# Patient Record
Sex: Female | Born: 1935 | Race: White | Hispanic: No | Marital: Married | Smoking: Never smoker
Health system: Southern US, Community
[De-identification: ages and names within clinical notes are randomized; demographics above are authoritative.]

## PROBLEM LIST (undated history)

## (undated) DIAGNOSIS — I4891 Unspecified atrial fibrillation: Secondary | ICD-10-CM

## (undated) DIAGNOSIS — I77819 Aortic ectasia, unspecified site: Secondary | ICD-10-CM

## (undated) DIAGNOSIS — C20 Malignant neoplasm of rectum: Secondary | ICD-10-CM

## (undated) DIAGNOSIS — I10 Essential (primary) hypertension: Secondary | ICD-10-CM

## (undated) DIAGNOSIS — K56609 Unspecified intestinal obstruction, unspecified as to partial versus complete obstruction: Secondary | ICD-10-CM

## (undated) DIAGNOSIS — R7989 Other specified abnormal findings of blood chemistry: Secondary | ICD-10-CM

## (undated) DIAGNOSIS — K219 Gastro-esophageal reflux disease without esophagitis: Secondary | ICD-10-CM

## (undated) DIAGNOSIS — N824 Other female intestinal-genital tract fistulae: Secondary | ICD-10-CM

## (undated) DIAGNOSIS — M25473 Effusion, unspecified ankle: Secondary | ICD-10-CM

## (undated) DIAGNOSIS — C541 Malignant neoplasm of endometrium: Secondary | ICD-10-CM

## (undated) DIAGNOSIS — K5790 Diverticulosis of intestine, part unspecified, without perforation or abscess without bleeding: Secondary | ICD-10-CM

## (undated) DIAGNOSIS — E538 Deficiency of other specified B group vitamins: Secondary | ICD-10-CM

## (undated) DIAGNOSIS — K2980 Duodenitis without bleeding: Secondary | ICD-10-CM

## (undated) DIAGNOSIS — E785 Hyperlipidemia, unspecified: Secondary | ICD-10-CM

## (undated) DIAGNOSIS — M47816 Spondylosis without myelopathy or radiculopathy, lumbar region: Secondary | ICD-10-CM

## (undated) DIAGNOSIS — M4696 Unspecified inflammatory spondylopathy, lumbar region: Secondary | ICD-10-CM

## (undated) DIAGNOSIS — D649 Anemia, unspecified: Secondary | ICD-10-CM

## (undated) DIAGNOSIS — N189 Chronic kidney disease, unspecified: Secondary | ICD-10-CM

## (undated) HISTORY — DX: Hyperlipidemia, unspecified: E78.5

## (undated) HISTORY — DX: Spondylosis without myelopathy or radiculopathy, lumbar region: M47.816

## (undated) HISTORY — DX: Other female intestinal-genital tract fistulae: N82.4

## (undated) HISTORY — DX: Gastro-esophageal reflux disease without esophagitis: K21.9

## (undated) HISTORY — PX: ABDOMINAL HYSTERECTOMY: SHX81

## (undated) HISTORY — DX: Anemia, unspecified: D64.9

## (undated) HISTORY — DX: Chronic kidney disease, unspecified: N18.9

## (undated) HISTORY — DX: Malignant neoplasm of rectum: C20

## (undated) HISTORY — DX: Aortic ectasia, unspecified site: I77.819

## (undated) HISTORY — PX: APPENDECTOMY: SHX54

## (undated) HISTORY — DX: Duodenitis without bleeding: K29.80

## (undated) HISTORY — DX: Essential (primary) hypertension: I10

## (undated) HISTORY — DX: Malignant neoplasm of endometrium: C54.1

---

## 2004-08-02 ENCOUNTER — Ambulatory Visit: Payer: Self-pay | Admitting: Family Medicine

## 2008-08-12 ENCOUNTER — Inpatient Hospital Stay: Payer: Self-pay | Admitting: Internal Medicine

## 2008-10-11 ENCOUNTER — Inpatient Hospital Stay: Payer: Self-pay | Admitting: Vascular Surgery

## 2008-10-27 ENCOUNTER — Ambulatory Visit: Payer: Self-pay | Admitting: Family Medicine

## 2009-07-19 ENCOUNTER — Inpatient Hospital Stay: Payer: Self-pay | Admitting: Specialist

## 2009-07-24 ENCOUNTER — Encounter: Payer: Self-pay | Admitting: Internal Medicine

## 2013-08-25 ENCOUNTER — Ambulatory Visit: Payer: Self-pay | Admitting: Family Medicine

## 2013-09-18 ENCOUNTER — Ambulatory Visit: Payer: Self-pay | Admitting: Gastroenterology

## 2013-09-20 LAB — PATHOLOGY REPORT

## 2013-09-25 ENCOUNTER — Ambulatory Visit: Payer: Self-pay | Admitting: Oncology

## 2013-09-25 LAB — CBC CANCER CENTER
BASOS ABS: 0.1 x10 3/mm (ref 0.0–0.1)
Basophil %: 0.9 %
Eosinophil #: 0.1 x10 3/mm (ref 0.0–0.7)
Eosinophil %: 1.5 %
HCT: 32.3 % — ABNORMAL LOW (ref 35.0–47.0)
HGB: 11 g/dL — AB (ref 12.0–16.0)
LYMPHS PCT: 22.5 %
Lymphocyte #: 2.3 x10 3/mm (ref 1.0–3.6)
MCH: 32.1 pg (ref 26.0–34.0)
MCHC: 34.1 g/dL (ref 32.0–36.0)
MCV: 94 fL (ref 80–100)
MONO ABS: 0.9 x10 3/mm (ref 0.2–0.9)
Monocyte %: 8.5 %
Neutrophil #: 6.7 x10 3/mm — ABNORMAL HIGH (ref 1.4–6.5)
Neutrophil %: 66.6 %
Platelet: 277 x10 3/mm (ref 150–440)
RBC: 3.42 10*6/uL — ABNORMAL LOW (ref 3.80–5.20)
RDW: 14 % (ref 11.5–14.5)
WBC: 10.1 x10 3/mm (ref 3.6–11.0)

## 2013-09-25 LAB — BASIC METABOLIC PANEL
Anion Gap: 6 — ABNORMAL LOW (ref 7–16)
BUN: 28 mg/dL — ABNORMAL HIGH (ref 7–18)
CALCIUM: 8.9 mg/dL (ref 8.5–10.1)
CHLORIDE: 105 mmol/L (ref 98–107)
CO2: 26 mmol/L (ref 21–32)
Creatinine: 1.59 mg/dL — ABNORMAL HIGH (ref 0.60–1.30)
GFR CALC AF AMER: 33 — AB
GFR CALC NON AF AMER: 28 — AB
Glucose: 132 mg/dL — ABNORMAL HIGH (ref 65–99)
OSMOLALITY: 281 (ref 275–301)
POTASSIUM: 4.7 mmol/L (ref 3.5–5.1)
SODIUM: 137 mmol/L (ref 136–145)

## 2013-09-26 LAB — CEA: CEA: 22.1 ng/mL — ABNORMAL HIGH (ref 0.0–4.7)

## 2013-09-30 ENCOUNTER — Ambulatory Visit: Payer: Self-pay | Admitting: Oncology

## 2013-10-19 ENCOUNTER — Ambulatory Visit: Payer: Self-pay | Admitting: Oncology

## 2013-10-27 LAB — CBC CANCER CENTER
BASOS PCT: 0.7 %
Basophil #: 0.1 x10 3/mm (ref 0.0–0.1)
EOS ABS: 0.2 x10 3/mm (ref 0.0–0.7)
Eosinophil %: 1.8 %
HCT: 31.9 % — AB (ref 35.0–47.0)
HGB: 10.6 g/dL — AB (ref 12.0–16.0)
LYMPHS ABS: 2.6 x10 3/mm (ref 1.0–3.6)
Lymphocyte %: 25.3 %
MCH: 31.2 pg (ref 26.0–34.0)
MCHC: 33.2 g/dL (ref 32.0–36.0)
MCV: 94 fL (ref 80–100)
MONOS PCT: 7.5 %
Monocyte #: 0.8 x10 3/mm (ref 0.2–0.9)
NEUTROS ABS: 6.6 x10 3/mm — AB (ref 1.4–6.5)
Neutrophil %: 64.7 %
PLATELETS: 240 x10 3/mm (ref 150–440)
RBC: 3.39 10*6/uL — AB (ref 3.80–5.20)
RDW: 13.7 % (ref 11.5–14.5)
WBC: 10.3 x10 3/mm (ref 3.6–11.0)

## 2013-11-03 LAB — CBC CANCER CENTER
Basophil #: 0.1 x10 3/mm (ref 0.0–0.1)
Basophil %: 0.8 %
Eosinophil #: 0.2 x10 3/mm (ref 0.0–0.7)
Eosinophil %: 1.8 %
HCT: 30.8 % — ABNORMAL LOW (ref 35.0–47.0)
HGB: 10.3 g/dL — ABNORMAL LOW (ref 12.0–16.0)
Lymphocyte #: 1.7 x10 3/mm (ref 1.0–3.6)
Lymphocyte %: 18.7 %
MCH: 31.9 pg (ref 26.0–34.0)
MCHC: 33.6 g/dL (ref 32.0–36.0)
MCV: 95 fL (ref 80–100)
MONO ABS: 0.7 x10 3/mm (ref 0.2–0.9)
Monocyte %: 8 %
NEUTROS ABS: 6.6 x10 3/mm — AB (ref 1.4–6.5)
Neutrophil %: 70.7 %
Platelet: 242 x10 3/mm (ref 150–440)
RBC: 3.24 10*6/uL — AB (ref 3.80–5.20)
RDW: 13.9 % (ref 11.5–14.5)
WBC: 9.4 x10 3/mm (ref 3.6–11.0)

## 2013-11-10 LAB — CBC CANCER CENTER
BASOS ABS: 0.1 x10 3/mm (ref 0.0–0.1)
Basophil %: 0.7 %
EOS ABS: 0.2 x10 3/mm (ref 0.0–0.7)
Eosinophil %: 1.6 %
HCT: 32.6 % — AB (ref 35.0–47.0)
HGB: 10.8 g/dL — AB (ref 12.0–16.0)
Lymphocyte #: 2.1 x10 3/mm (ref 1.0–3.6)
Lymphocyte %: 18.8 %
MCH: 31.2 pg (ref 26.0–34.0)
MCHC: 33.2 g/dL (ref 32.0–36.0)
MCV: 94 fL (ref 80–100)
Monocyte #: 0.9 x10 3/mm (ref 0.2–0.9)
Monocyte %: 7.9 %
Neutrophil #: 7.8 x10 3/mm — ABNORMAL HIGH (ref 1.4–6.5)
Neutrophil %: 71 %
Platelet: 270 x10 3/mm (ref 150–440)
RBC: 3.47 10*6/uL — ABNORMAL LOW (ref 3.80–5.20)
RDW: 13.9 % (ref 11.5–14.5)
WBC: 11.1 x10 3/mm — AB (ref 3.6–11.0)

## 2013-11-17 LAB — CBC CANCER CENTER
Basophil #: 0.1 x10 3/mm (ref 0.0–0.1)
Basophil %: 0.5 %
EOS PCT: 0.9 %
Eosinophil #: 0.1 x10 3/mm (ref 0.0–0.7)
HCT: 30.3 % — AB (ref 35.0–47.0)
HGB: 10.2 g/dL — AB (ref 12.0–16.0)
Lymphocyte #: 1.6 x10 3/mm (ref 1.0–3.6)
Lymphocyte %: 15.6 %
MCH: 31.6 pg (ref 26.0–34.0)
MCHC: 33.5 g/dL (ref 32.0–36.0)
MCV: 95 fL (ref 80–100)
Monocyte #: 0.8 x10 3/mm (ref 0.2–0.9)
Monocyte %: 7.4 %
NEUTROS PCT: 75.6 %
Neutrophil #: 7.9 x10 3/mm — ABNORMAL HIGH (ref 1.4–6.5)
Platelet: 250 x10 3/mm (ref 150–440)
RBC: 3.21 10*6/uL — AB (ref 3.80–5.20)
RDW: 13.6 % (ref 11.5–14.5)
WBC: 10.4 x10 3/mm (ref 3.6–11.0)

## 2013-11-19 ENCOUNTER — Ambulatory Visit: Payer: Self-pay | Admitting: Oncology

## 2013-11-24 LAB — CBC CANCER CENTER
BASOS ABS: 0.1 x10 3/mm (ref 0.0–0.1)
BASOS PCT: 0.5 %
EOS ABS: 0.1 x10 3/mm (ref 0.0–0.7)
EOS PCT: 0.9 %
HCT: 31.9 % — ABNORMAL LOW (ref 35.0–47.0)
HGB: 10.7 g/dL — ABNORMAL LOW (ref 12.0–16.0)
Lymphocyte #: 1.4 x10 3/mm (ref 1.0–3.6)
Lymphocyte %: 14.1 %
MCH: 31.6 pg (ref 26.0–34.0)
MCHC: 33.5 g/dL (ref 32.0–36.0)
MCV: 94 fL (ref 80–100)
MONOS PCT: 7.2 %
Monocyte #: 0.7 x10 3/mm (ref 0.2–0.9)
NEUTROS PCT: 77.3 %
Neutrophil #: 7.8 x10 3/mm — ABNORMAL HIGH (ref 1.4–6.5)
PLATELETS: 286 x10 3/mm (ref 150–440)
RBC: 3.38 10*6/uL — AB (ref 3.80–5.20)
RDW: 13.7 % (ref 11.5–14.5)
WBC: 10.1 x10 3/mm (ref 3.6–11.0)

## 2013-12-19 ENCOUNTER — Ambulatory Visit: Payer: Self-pay | Admitting: Oncology

## 2013-12-30 ENCOUNTER — Ambulatory Visit: Payer: Self-pay | Admitting: Oncology

## 2014-01-19 ENCOUNTER — Ambulatory Visit: Payer: Self-pay | Admitting: Oncology

## 2014-04-10 ENCOUNTER — Ambulatory Visit: Payer: Self-pay | Admitting: Oncology

## 2014-04-13 ENCOUNTER — Ambulatory Visit: Payer: Self-pay | Admitting: Oncology

## 2014-04-13 LAB — COMPREHENSIVE METABOLIC PANEL WITH GFR
Albumin: 2.7 g/dL — ABNORMAL LOW
Alkaline Phosphatase: 94 U/L
Anion Gap: 9
BUN: 18 mg/dL
Bilirubin,Total: 0.5 mg/dL
Calcium, Total: 8.9 mg/dL
Chloride: 97 mmol/L — ABNORMAL LOW
Co2: 28 mmol/L
Creatinine: 1.15 mg/dL
EGFR (African American): 48 — ABNORMAL LOW
EGFR (Non-African Amer.): 41 — ABNORMAL LOW
Glucose: 105 mg/dL — ABNORMAL HIGH
Osmolality: 271
Potassium: 3.9 mmol/L
SGOT(AST): 12 U/L — ABNORMAL LOW
SGPT (ALT): 10 U/L — ABNORMAL LOW
Sodium: 134 mmol/L — ABNORMAL LOW
Total Protein: 6.7 g/dL

## 2014-04-13 LAB — CBC CANCER CENTER
Basophil #: 0.1 "x10 3/mm "
Basophil %: 1 %
Eosinophil #: 0.1 "x10 3/mm "
Eosinophil %: 0.4 %
HCT: 32.6 % — ABNORMAL LOW
HGB: 10.5 g/dL — ABNORMAL LOW
Lymphocyte %: 11 %
Lymphs Abs: 1.6 "x10 3/mm "
MCH: 29.9 pg
MCHC: 32.1 g/dL
MCV: 93 fL
Monocyte #: 0.8 "x10 3/mm "
Monocyte %: 5.8 %
Neutrophil #: 11.6 "x10 3/mm " — ABNORMAL HIGH
Neutrophil %: 81.8 %
Platelet: 329 "x10 3/mm "
RBC: 3.5 "x10 6/mm " — ABNORMAL LOW
RDW: 15.4 % — ABNORMAL HIGH
WBC: 14.2 "x10 3/mm " — ABNORMAL HIGH

## 2014-04-21 ENCOUNTER — Ambulatory Visit: Payer: Self-pay | Admitting: Oncology

## 2014-04-23 LAB — BASIC METABOLIC PANEL
Anion Gap: 7 (ref 7–16)
BUN: 18 mg/dL (ref 7–18)
CHLORIDE: 100 mmol/L (ref 98–107)
CREATININE: 1.14 mg/dL (ref 0.60–1.30)
Calcium, Total: 8.6 mg/dL (ref 8.5–10.1)
Co2: 30 mmol/L (ref 21–32)
GFR CALC AF AMER: 48 — AB
GFR CALC NON AF AMER: 42 — AB
Glucose: 96 mg/dL (ref 65–99)
Osmolality: 276 (ref 275–301)
Potassium: 3.9 mmol/L (ref 3.5–5.1)
SODIUM: 137 mmol/L (ref 136–145)

## 2014-04-23 LAB — CBC WITH DIFFERENTIAL/PLATELET
BASOS ABS: 0.1 10*3/uL (ref 0.0–0.1)
Basophil %: 0.7 %
EOS ABS: 0.1 10*3/uL (ref 0.0–0.7)
EOS PCT: 0.9 %
HCT: 31.2 % — ABNORMAL LOW (ref 35.0–47.0)
HGB: 10.1 g/dL — AB (ref 12.0–16.0)
LYMPHS PCT: 16.7 %
Lymphocyte #: 1.8 10*3/uL (ref 1.0–3.6)
MCH: 30.4 pg (ref 26.0–34.0)
MCHC: 32.4 g/dL (ref 32.0–36.0)
MCV: 94 fL (ref 80–100)
Monocyte #: 0.8 x10 3/mm (ref 0.2–0.9)
Monocyte %: 7 %
Neutrophil #: 8.2 10*3/uL — ABNORMAL HIGH (ref 1.4–6.5)
Neutrophil %: 74.7 %
PLATELETS: 327 10*3/uL (ref 150–440)
RBC: 3.33 10*6/uL — ABNORMAL LOW (ref 3.80–5.20)
RDW: 15.6 % — ABNORMAL HIGH (ref 11.5–14.5)
WBC: 11 10*3/uL (ref 3.6–11.0)

## 2014-04-24 ENCOUNTER — Inpatient Hospital Stay: Payer: Self-pay | Admitting: Surgery

## 2014-04-26 LAB — PLATELET COUNT: PLATELETS: 288 10*3/uL (ref 150–440)

## 2014-04-30 LAB — PLATELET COUNT: Platelet: 311 10*3/uL (ref 150–440)

## 2014-05-04 ENCOUNTER — Encounter: Payer: Self-pay | Admitting: Internal Medicine

## 2014-05-04 LAB — PLATELET COUNT: PLATELETS: 328 10*3/uL (ref 150–440)

## 2014-05-08 ENCOUNTER — Emergency Department: Payer: Self-pay | Admitting: Emergency Medicine

## 2014-05-08 LAB — URINALYSIS, COMPLETE
Bilirubin,UR: NEGATIVE
GLUCOSE, UR: NEGATIVE mg/dL (ref 0–75)
Hyaline Cast: 1
Ketone: NEGATIVE
Nitrite: POSITIVE
Ph: 6 (ref 4.5–8.0)
SQUAMOUS EPITHELIAL: NONE SEEN
Specific Gravity: 1.006 (ref 1.003–1.030)

## 2014-05-08 LAB — COMPREHENSIVE METABOLIC PANEL
Albumin: 2.1 g/dL — ABNORMAL LOW (ref 3.4–5.0)
Alkaline Phosphatase: 131 U/L — ABNORMAL HIGH
Anion Gap: 8 (ref 7–16)
BILIRUBIN TOTAL: 0.4 mg/dL (ref 0.2–1.0)
BUN: 12 mg/dL (ref 7–18)
CALCIUM: 8.3 mg/dL — AB (ref 8.5–10.1)
Chloride: 101 mmol/L (ref 98–107)
Co2: 24 mmol/L (ref 21–32)
Creatinine: 0.9 mg/dL (ref 0.60–1.30)
EGFR (African American): >60
EGFR (Non-African Amer.): 56 — ABNORMAL LOW
Glucose: 124 mg/dL — ABNORMAL HIGH (ref 65–99)
Osmolality: 268 (ref 275–301)
Potassium: 3.5 mmol/L (ref 3.5–5.1)
SGOT(AST): 29 U/L (ref 15–37)
SGPT (ALT): 18 U/L
Sodium: 133 mmol/L — ABNORMAL LOW (ref 136–145)
Total Protein: 6.2 g/dL — ABNORMAL LOW (ref 6.4–8.2)

## 2014-05-08 LAB — CBC WITH DIFFERENTIAL/PLATELET
Basophil #: 0.1 10*3/uL (ref 0.0–0.1)
Basophil %: 0.9 %
Eosinophil #: 0.1 10*3/uL (ref 0.0–0.7)
Eosinophil %: 0.9 %
HCT: 33.6 % — AB (ref 35.0–47.0)
HGB: 10.7 g/dL — ABNORMAL LOW (ref 12.0–16.0)
Lymphocyte #: 1.5 10*3/uL (ref 1.0–3.6)
Lymphocyte %: 11.8 %
MCH: 29.5 pg (ref 26.0–34.0)
MCHC: 31.8 g/dL — ABNORMAL LOW (ref 32.0–36.0)
MCV: 93 fL (ref 80–100)
MONO ABS: 0.9 x10 3/mm (ref 0.2–0.9)
MONOS PCT: 6.9 %
Neutrophil #: 10.2 10*3/uL — ABNORMAL HIGH (ref 1.4–6.5)
Neutrophil %: 79.5 %
PLATELETS: 325 10*3/uL (ref 150–440)
RBC: 3.63 10*6/uL — AB (ref 3.80–5.20)
RDW: 15.7 % — ABNORMAL HIGH (ref 11.5–14.5)
WBC: 12.9 10*3/uL — ABNORMAL HIGH (ref 3.6–11.0)

## 2014-05-08 LAB — TROPONIN I: Troponin-I: <0.02 ng/mL

## 2014-05-14 ENCOUNTER — Emergency Department: Payer: Self-pay | Admitting: Emergency Medicine

## 2014-05-14 LAB — COMPREHENSIVE METABOLIC PANEL
ALT: 15 U/L
ANION GAP: 8 (ref 7–16)
Albumin: 2.4 g/dL — ABNORMAL LOW (ref 3.4–5.0)
Alkaline Phosphatase: 103 U/L
BUN: 11 mg/dL (ref 7–18)
Bilirubin,Total: 0.3 mg/dL (ref 0.2–1.0)
CHLORIDE: 103 mmol/L (ref 98–107)
CREATININE: 1.02 mg/dL (ref 0.60–1.30)
Calcium, Total: 7.9 mg/dL — ABNORMAL LOW (ref 8.5–10.1)
Co2: 26 mmol/L (ref 21–32)
EGFR (African American): >60
GFR CALC NON AF AMER: 54 — AB
Glucose: 103 mg/dL — ABNORMAL HIGH (ref 65–99)
Osmolality: 273 (ref 275–301)
Potassium: 3.6 mmol/L (ref 3.5–5.1)
SGOT(AST): 28 U/L (ref 15–37)
SODIUM: 137 mmol/L (ref 136–145)
Total Protein: 6.1 g/dL — ABNORMAL LOW (ref 6.4–8.2)

## 2014-05-14 LAB — CBC WITH DIFFERENTIAL/PLATELET
Basophil #: 0.1 10*3/uL (ref 0.0–0.1)
Basophil %: 0.6 %
EOS ABS: 0.1 10*3/uL (ref 0.0–0.7)
Eosinophil %: 0.7 %
HCT: 31.2 % — AB (ref 35.0–47.0)
HGB: 10.3 g/dL — ABNORMAL LOW (ref 12.0–16.0)
Lymphocyte #: 1.1 10*3/uL (ref 1.0–3.6)
Lymphocyte %: 11.3 %
MCH: 30 pg (ref 26.0–34.0)
MCHC: 33 g/dL (ref 32.0–36.0)
MCV: 91 fL (ref 80–100)
Monocyte #: 0.7 x10 3/mm (ref 0.2–0.9)
Monocyte %: 7.5 %
Neutrophil #: 7.5 10*3/uL — ABNORMAL HIGH (ref 1.4–6.5)
Neutrophil %: 79.9 %
Platelet: 246 10*3/uL (ref 150–440)
RBC: 3.43 10*6/uL — AB (ref 3.80–5.20)
RDW: 15.7 % — ABNORMAL HIGH (ref 11.5–14.5)
WBC: 9.4 10*3/uL (ref 3.6–11.0)

## 2014-05-14 LAB — TROPONIN I: Troponin-I: <0.02 ng/mL

## 2014-05-21 ENCOUNTER — Ambulatory Visit: Payer: Self-pay | Admitting: Oncology

## 2014-05-21 ENCOUNTER — Encounter: Payer: Self-pay | Admitting: Internal Medicine

## 2014-06-21 ENCOUNTER — Encounter: Payer: Self-pay | Admitting: Internal Medicine

## 2014-06-21 ENCOUNTER — Ambulatory Visit: Payer: Self-pay | Admitting: Oncology

## 2014-07-21 ENCOUNTER — Encounter: Payer: Self-pay | Admitting: Internal Medicine

## 2014-08-21 ENCOUNTER — Encounter: Payer: Self-pay | Admitting: Internal Medicine

## 2014-09-10 ENCOUNTER — Ambulatory Visit: Payer: Self-pay | Admitting: Oncology

## 2014-09-10 LAB — CBC CANCER CENTER
BASOS ABS: 0.1 x10 3/mm (ref 0.0–0.1)
Basophil %: 1 %
EOS ABS: 0.1 x10 3/mm (ref 0.0–0.7)
Eosinophil %: 1.3 %
HCT: 37.8 % (ref 35.0–47.0)
HGB: 12.4 g/dL (ref 12.0–16.0)
Lymphocyte #: 1.2 x10 3/mm (ref 1.0–3.6)
Lymphocyte %: 15 %
MCH: 28.4 pg (ref 26.0–34.0)
MCHC: 32.8 g/dL (ref 32.0–36.0)
MCV: 87 fL (ref 80–100)
Monocyte #: 0.6 x10 3/mm (ref 0.2–0.9)
Monocyte %: 8.1 %
NEUTROS ABS: 5.8 x10 3/mm (ref 1.4–6.5)
NEUTROS PCT: 74.6 %
Platelet: 231 x10 3/mm (ref 150–440)
RBC: 4.37 10*6/uL (ref 3.80–5.20)
RDW: 15.8 % — AB (ref 11.5–14.5)
WBC: 7.8 x10 3/mm (ref 3.6–11.0)

## 2014-09-10 LAB — COMPREHENSIVE METABOLIC PANEL
ALT: 14 U/L
ANION GAP: 10 (ref 7–16)
AST: 13 U/L — AB (ref 15–37)
Albumin: 3.5 g/dL (ref 3.4–5.0)
Alkaline Phosphatase: 132 U/L — ABNORMAL HIGH
BUN: 17 mg/dL (ref 7–18)
Bilirubin,Total: 0.9 mg/dL (ref 0.2–1.0)
CALCIUM: 8.8 mg/dL (ref 8.5–10.1)
CHLORIDE: 102 mmol/L (ref 98–107)
CO2: 27 mmol/L (ref 21–32)
Creatinine: 1.17 mg/dL (ref 0.60–1.30)
GFR CALC AF AMER: 56 — AB
GFR CALC NON AF AMER: 46 — AB
Glucose: 112 mg/dL — ABNORMAL HIGH (ref 65–99)
Osmolality: 280 (ref 275–301)
Potassium: 3.5 mmol/L (ref 3.5–5.1)
Sodium: 139 mmol/L (ref 136–145)
Total Protein: 7.3 g/dL (ref 6.4–8.2)

## 2014-09-11 LAB — CEA: CEA: 6.6 ng/mL — ABNORMAL HIGH (ref 0.0–4.7)

## 2014-09-21 ENCOUNTER — Ambulatory Visit: Payer: Self-pay | Admitting: Oncology

## 2014-12-12 NOTE — Discharge Summary (Signed)
PATIENT NAME:  [name], [name] MR#:  [id number] DATE OF BIRTH:  Sep 06, 1919  DATE OF ADMISSION:  04/21/2014 DATE OF DISCHARGE:  05/04/2014  HISTORY OF PRESENT ILLNESS: This 81 year old female was admitted emergently on the surgery service. She had recently presented to the office with a chief complaint of anal pain. She was referred by Dr. Grayland Ormond. She has a history of rectal cancer, which was treated primarily with radiation therapy and completed radiation in April, recently developed a feculent vaginal discharge and having continued feculent drainage from the vagina and from the rectum with associated pain. Also, she has had urinary incontinence. She had recent CT findings of rectovaginal fistula.   PAST MEDICAL HISTORY: 1. Includes a distant history of atrial fibrillation, which was transient.  2. Hyperlipidemia.  3. Hypertension.  4. Aortic ectasia.  5. Lumbar spondylosis.  6. Duodenitis.   PAST SURGICAL HISTORY: 1. Hysterectomy with bilateral salpingo-oophorectomy.  2. Appendectomy.  3. History of exploratory laparotomy for bowel obstruction with lysis of adhesions.  4. History of hip fracture with hemiarthroplasty.   SOCIAL HISTORY: She is married and accompanied by her family.   REVIEW OF SYSTEMS: She reports no difficulty breathing. No chest pains. When she presented to the same day surgery center for preadmission testing, she was found to be tachycardic and was admitted emergently due to tachycardia.   PHYSICAL EXAMINATION:  VITAL SIGNS: On the initial surgery examination, her blood pressure was 133/93, pulse rate was 67. This was prior to the onset of tachycardia.  LUNGS: At the time of surgical consultation, her lung sounds were clear.  HEART: Was with an irregular rhythm at that time, pulse rate 67.  ABDOMEN: Soft and flat and nontender with no palpable mass.  RECTAL AND VAGINAL: Examination demonstrated rectovaginal fistula. There was evidence of inflammation of the skin  surrounding the vagina.   CLINICAL DATA: Recent laboratory work on August 24, glucose 105, creatinine 1.15, albumin was low at 2.7, hemoglobin 10.5.   The patient was admitted emergently with tachycardia and consulted Dr. Saralyn Pilar, who treated her with diltiazem (Dictation Anomaly) <<MISSING TEXT>> q.8 h. and also metoprolol 25 mg q.6 h.   Following admission, she was monitored with telemetry and continued to have some intermittent tachycardia, but was improved.   She was later carried to the operating room on September 4, had laparoscopy with lysis of adhesions and creation of a loop sigmoid colostomy. She did have a preoperative prophylactic antibiotic. She also had prophylactic subcutaneous heparin.   Postoperatively, she was continued on telemetry, which was off unit telemetry. Continued to have occasional tachycardia, continued to have atrial fibrillation. She has progressed satisfactorily with eating and walking in the hall, has had multiple bowel movements per colostomy. Does have a red rubber catheter bridge.   Her admission hemoglobin on September 3 was 10.1. Her creatinine was 1.14.   FINAL DIAGNOSES: 1. Carcinoma of the rectum.  2. Rectovaginal fistula.  3. Atrial fibrillation with rapid ventricular response.   Plan for discharge to be transferred to [facility] for skilled nursing care and routine colostomy care.   DISCHARGE MEDICATIONS: Will include diltiazem 60 mg q.8 h., metoprolol tartrate 50 mg q.12 h., docusate sodium 100 mg daily, acetaminophen as needed for moderate pain, Norco as needed for moderate pain.   PLAN:  1. Follow up in the office in 2 weeks.  2. Also anticipate subsequent follow-up oncology care.    ____________________________ J. Rochel Brome, MD jws:JT D: 05/04/2014 10:11:00 ET T: 05/04/2014 10:56:12 ET  JOB#: [id number]  cc: Loreli Dollar, MD, <Dictator>

## 2014-12-12 NOTE — Consult Note (Signed)
Chief Complaint:  Subjective/Chief Complaint The patient is doing fine the postop with reduced abdominal pain denies nausea. eating full liquids now.   sitting up in the chair.   VITAL SIGNS/ANCILLARY NOTES: **Vital Signs.:   07-Sep-15 11:58  Vital Signs Type QID  Temperature Temperature (F) 97.6  Celsius 36.4  Temperature Source oral  Pulse Pulse 103  Respirations Respirations 18  Systolic BP Systolic BP 762  Diastolic BP (mmHg) Diastolic BP (mmHg) 69  Mean BP 84  Pulse Ox % Pulse Ox % 94  Pulse Ox Activity Level  At rest  Oxygen Delivery Room Air/ 21 %  *Intake and Output.:   07-Sep-15 11:59  Grand Totals Intake:  259 Output:      Net:  4 24 Hr.:  438  Unmeasured Intake  Sips  IV (Primary)      In:  259  Urinary Method  Void; BSC; Incontinent; Depends   Brief Assessment:  GEN well developed, well nourished, no acute distress   Cardiac Irregular  murmur present  -- JVD   Respiratory normal resp effort  clear BS   Gastrointestinal Normal   Gastrointestinal details normal Soft  No gaurding  No rigidity   EXTR negative cyanosis/clubbing, negative edema   Lab Results: Routine Hem:  06-Sep-15 05:22   Platelet Count (CBC) 288 (Result(s) reported on 26 Apr 2014 at 05:59AM.)   Radiology Results: Cardiology:    02-Sep-15 14:21, ECG  Ventricular Rate 125  Atrial Rate 104  QRS Duration 74  QT 304  QTc 438  R Axis 0  T Axis -5  ECG interpretation   Atrial fibrillation with rapid ventricular response with premature ventricular or aberrantly conducted complexes  Low voltage QRS  Possible Inferior infarct (cited on or before 19-Jul-2009)  Abnormal ECG  When compared with ECG of 19-Jul-2009 17:41,  Atrial fibrillation has replaced Sinus rhythm  Vent. rate has increased BY  62 BPM  ST now depressed in Lateral leads  Confirmed by Arlan Birks ([id number]) on 04/24/2014 2:21:11 PM    Overreader: Lujean Amel  ECG     03-Sep-15 08:56, Echo Doppler  Echo Doppler    REASON FOR EXAM:      COMMENTS:       PROCEDURE: [place] - ECHO DOPPLER COMPLETE(TRANSTHOR)  - Apr 23 2014  8:56AM     RESULT: Echocardiogram Report    Patient Name:   [name] Date of Exam: 04/23/2014  Medical Rec #:  [id number]            Custom1:  Date of Birth:  Nov 28, 1919         Height:  Patient Age:    81 years          Weight:       125.0 lb  Patient Gender: F                 BSA:          1.48 m??    Indications: Atrial Fib  Sonographer:    Sherrie Sport RDCS  Referring Phys: Rochel Brome, J    Summary:   1. Left ventricular ejection fraction, by visual estimation, is 60 to   65%.   2. Normal global left ventricular systolic function.   3. Decreased left ventricular internal cavity size.   4. Mild to moderate mitral valve regurgitation.  5. Mild tricuspid regurgitation.  2D AND M-MODE MEASUREMENTS (normal ranges within parentheses):  Left Ventricle:  Normal  IVSd (2D):      1.11 cm (0.7-1.1)  LVPWd (2D):     0.98 cm (0.7-1.1) Aorta/LA:                  Normal  LVIDd (2D):3.43 cm (3.4-5.7) Aortic Root (2D): 2.80 cm (2.4-3.7)  LVIDs (2D):     2.25 cm           Left Atrium (2D): 4.60 cm (1.9-4.0)  LV FS (2D):     34.4 %   (>25%)  LV EF (2D):     64.6 %   (>50%)                                    Right Ventricle:                             RVd (2D):        2.42 cm  LV DIASTOLIC FUNCTION:  MV Peak E: 1.19 m/s E/e' Ratio: 8.30                      Decel Time: 180 msec  SPECTRAL DOPPLER ANALYSIS (where applicable):  Mitral Valve:  MV P1/2 Time: 52.20 msec  MV Area, PHT: 4.21 cm??  Aortic Valve: AoV Max Vel: 1.35 m/s AoV Peak PG: 7.2 mmHg AoV Mean PG:  LVOT Vmax: 1.00 m/s LVOT VTI:  LVOT Diameter: 2.00 cm  AoV Area, Vmax: 2.33 cm?? AoV Area, VTI:  AoV Area, Vmn:  Tricuspid Valve and PA/RV Systolic Pressure: TR Max Velocity: 2.53 m/s RA     Pressure: 5 mmHg RVSP/PASP: 30.6 mmHg  Pulmonic Valve:  PV Max Velocity: 0.76 m/s PV Max PG: 2.3 mmHg PV Mean  PG:    PHYSICIAN INTERPRETATION:  Left Ventricle: The left ventricular internal cavity size was decreased.   LVseptal wall thickness was normal. LV posterior wall thickness was   normal. Global LV systolic function was normal. Left ventricular ejection   fraction, by visual estimation, is 60 to 65%.  Right Ventricle: The right ventricular size is normal. Global RV systolic   function is normal.  Left Atrium: The left atrium is normal in size.  Right Atrium: The right atrium is normal in size.  Pericardium: There is no evidence of pericardial effusion.  Mitral Valve: The mitral valve is normal in structure. Mild to moderate     mitral valve regurgitation is seen.  Tricuspid Valve: Mild tricuspid regurgitation is visualized. The   tricuspid regurgitant velocity is 2.53 m/s, and with an assumed right   atrial pressure of 5 mmHg, the estimated right ventricular systolic   pressure is normal at 30.6 mmHg.  Aortic Valve: The aortic valve is normal. No evidence of aortic valve   regurgitation is seen.  Pulmonic Valve: The pulmonic valve is normal.    [place] MD  Electronically signed by Franco Nones MD  Signature Date/Time: 04/23/2014/5:33:43 PM    *** Final ***  IMPRESSION: .        Verified By: Yolonda Kida, M.D., MD   Assessment/Plan:  Assessment/Plan:  Assessment IMP  atrial fibrillation  postop  surgery  abdominal discomfort  nausea  hypertension  arthritis .   Plan PLAN  continue rate control for atrial fibrillation  hold off on anticoagulation long-term  advanced diet  increase activity from including physical therapy  continue therapy for nausea  up out of bed to chair   continue medical therapy for now   Electronic Signatures: Yolonda Kida (MD)  (Signed 07-Sep-15 13:45)  Authored: Chief Complaint, VITAL SIGNS/ANCILLARY NOTES, Brief Assessment, Lab Results, Radiology Results, Assessment/Plan   Last Updated: 07-Sep-15 13:45 by  Yolonda Kida (MD)

## 2014-12-12 NOTE — Op Note (Signed)
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   PROCEDURE: Laparoscopic lysis of adhesions and colostomy.   SURGEON: Rochel Brome, MD  ANESTHESIA: General.   INDICATIONS: This 81 year old female has a history of rectal cancer and radiation therapy, recently developed feculent drainage from her vagina, had CT and physical findings of rectovaginal fistula, and diverting colostomy was recommended for palliative care.   The patient was placed on the operating table in the supine position under general endotracheal anesthesia. The abdomen was prepared with ChloraPrep and draped in a sterile manner.   A short incision was made in the epigastrium, carried down to the deep fascia which was grasped with laryngeal hook and elevated. A Veress needle was inserted, aspirated, and irrigated with a saline solution. Next, the peritoneal cavity was inflated with carbon dioxide. The Veress needle was removed. The 10 mm cannula was inserted. The 10 mm, 0 degree laparoscope was inserted to view the peritoneal cavity. There were multiple adhesions identified between the omentum and the anterior abdominal wall and between small bowel and anterior abdominal wall. Another incision was made in the right mid abdomen to introduce a 5 mm cannula and another in the suprapubic area of the left lower quadrant to introduce a 5 mm cannula. Multiple adhesions were taken down between the omentum and the anterior abdominal wall with use of the Harmonic scalpel. Also a loop of small bowel, which was adherent to the anterior abdominal wall, was taken down with a combination of the Harmonic scalpel and also sharp scissor dissection. Next, the omentum overlying the sigmoid colon was  dissected away from the sigmoid colon. There were multiple adhesions here that were divided with the Harmonic scalpel. Next, the sigmoid colon was mobilized with incision of the lateral peritoneal reflection. There appeared to be satisfactory mobility of the colon for a loop sigmoid colostomy. Additional dissection was carried out further mobilizing this segment of colon. It did appear that the mesentery was somewhat short. Next, a small opening was made in the mesentery adjacent to the bowel with use of Harmonic scalpel and with blunt dissection. Next, a 16-gauge red rubber catheter was cut to create a segment about 5 inches in length. This was inserted into the peritoneal cavity and passed around the loop of sigmoid colon and was folded over so that both ends could be grasped with the same grasper. It is noted that during the course of the dissection there was some ascites identified, which was aspirated. Bleeding was very scant and hemostasis was subsequently intact. The abdominal wall was examined and could see a satisfactory place for creation of the colostomy midway between the navel and the anterior/superior iliac spine. A circular incision was made in the skin and carried down through a thin layer of subcutaneous tissues and removed this circular portion of skin. Next, the dissection was carried further down through subcutaneous tissues extending approximally an inch to reach the deep fascia where a cruciate incision was made in the anterior rectus sheath. Next, rectus muscle fibers were divided bluntly with Kelly clamps and identified the posterior sheath. A cruciate incision was made in the posterior sheath, and this was dilated large enough to admit 2 fingers. The carbon dioxide came out of the peritoneal cavity. The red rubber catheter was brought up into  the operative field through the abdominal wall defect and traction was applied as the laparoscopic cannulas were removed and additional air allowed to  escape from the peritoneal cavity. Next, with additional finger dissection, the sigmoid colon was further mobilized up into the wound. There did again appear to be some shortening of mesentery. The hemostasis appeared to be intact. The red rubber catheter was sutured to the skin medially and laterally with 3-0 nylon suture. This held the sigmoid colon up above the fascia. Next, the laparoscopic port sites were closed with interrupted 4-0 nylon vertical mattress sutures. Next, the colostomy was matured by making a longitudinally oriented incision, that is longitudinally oriented according to the bowel, and the seromuscular part of the bowel was sutured to the skin with interrupted 5-0 Vicryl sutures.   Following this the dressings were applied to the port sites using folded gauze and 2 inch tape. The skin surrounding the stoma was treated with benzoin and then a colostomy bag was attached.  The patient appeared to be in satisfactory condition and was prepared for transfer to the recovery room. ____________________________ Lenna Sciara. Rochel Brome, MD jws:sb D: 04/24/2014 14:15:01 ET T: 04/24/2014 15:50:42 ET JOB#: [id number]  cc: Loreli Dollar, MD, <Dictator> Loreli Dollar MD ELECTRONICALLY SIGNED 05/01/2014 17:22

## 2014-12-12 NOTE — Consult Note (Signed)
Reason for Visit: This 81 year old Female patient presents to the clinic for initial evaluation of  rectal cancer .   Referred by Dr. Grayland Ormond.  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 She developed rectal pain which progressed to rectal incontinence. No she underwent lower endoscopy showingA fungating partially obstructing large mass was found in the distalrectum. The mass was partially circumferential (involving two-thirds ofthe lumen circumference). Oozing was present. biopsy was positive for moderately differentiated invasive adenocarcinoma. PET CT scan was performed showing hypermetabolic activity in the distal rectum with no evidence of regional nodal disease or distant metastatic disease. Except for rectal incontinence patient is having no significant pain at this time. She does have occasional bleeding. On my review of her PET/CT she has significant clips in her pelvis consistent with prior surgery for ovarian cancer. Patient also claims she had external beam as well as an implant for ovarian cancer 20 years prior at [facility].   Past Hx:    Rectal Ca:    Ovarian Cancer:    htn:    Diverticulitis:    left hip repair: 23-Jul-2009   right hip hemiarthroplasty: 20-Jul-2009   bowel obstruction:    Hysterectomy - Total:   Past, Family and Social History:  Past Medical History positive   Cardiovascular hypertension   Gastrointestinal history of bowel structures, history of diverticulitis   Cancer ovarian crit cancer as described  above   Past Surgical History TAH/BSO and pelvic lymph node sampling, right hip hemiarthroplasty   Past Medical History Comments B12 deficiency   Family History positive   Family History Comments family history of hypertension and coronary artery disease and adult onset diabetes   Social History noncontributory   Additional Past Medical and Surgical History compliment by her husband and multiple family members today   Allergies:   Sulfa drugs: Rash  Demerol HCl: N/V/Diarrhea  Home Meds:  Home Medications: Medication Instructions Status  Norvasc 10 mg oral tablet 1  orally once a day  Active  benazepril 20 mg oral tablet   once a day  Active  Klor-Con 10 oral tablet, extended release daily  Active  furosemide 20 mg oral tablet daily  Active  atenolol 50 mg oral tablet 1  orally once a day  Active  pravastatin 20 mg oral tablet 1 tab(s) orally once a day (at bedtime) Active  acetaminophen-HYDROcodone 325 mg-5 mg oral tablet 1 tab(s) orally every 6 hours, As Needed - for Pain Active  omeprazole 20 mg oral delayed release tablet 1 tab(s) orally once a day Active  cyanocobalamin 1000 mcg oral tablet 1 tab(s) orally once a day Active   Review of Systems:  General negative   Performance Status (ECOG) 1   Skin negative   Breast negative   Ophthalmologic negative   ENMT negative   Respiratory and Thorax negative   Cardiovascular negative   Gastrointestinal see HPI   Genitourinary negative   Musculoskeletal negative   Neurological negative   Psychiatric negative   Hematology/Lymphatics negative   Endocrine negative   Allergic/Immunologic negative   Review  of Systems   review of systems obtained from nurse's notes  Nursing Notes:  Nursing Vital Signs and Chemo Nursing Nursing Notes: *CC Vital Signs Flowsheet:   12-Feb-15 13:44  Temp Temperature 98.6  Pulse Pulse 67  Respirations Respirations 20  SBP SBP 132  DBP DBP 58  Pain Scale (0-10)  0  Current  Weight (kg) (kg) 65.5  Height (cm) centimeters 154.9  BSA (m2) 1.6   Physical Exam:  General/Skin/HEENT:  Skin normal   Eyes normal   ENMT normal   Head and Neck normal   Additional PE well-developed elderly wheelchair-bound female in NAD. Lungs are clear to A&P cardiac examination shows regular rate and rhythm. Abdomen is benign with no organomegaly or masses noted. No inguinal adenopathy is identified. No peripheral adenopathy in the lower extremities is noted.   Breasts/Resp/CV/GI/GU:  Respiratory and Thorax normal   Cardiovascular normal   Gastrointestinal normal   Genitourinary normal   MS/Neuro/Psych/Lymph:  Musculoskeletal normal   Neurological normal   Lymphatics normal   Other Results:  Radiology Results: LabUnknown:    05-Jan-15 11:36, CT Abdomen and Pelvis Without Contrast  PACS Image     10-Feb-15 09:50, PET/CT Scan Colorectal Cancer Initial Stage  PACS Image   CT:    05-Jan-15 11:36, CT Abdomen and Pelvis Without Contrast  CT Abdomen and Pelvis Without Contrast   REASON FOR EXAM:    Abd Pain Hx Ovarian CA LOW GFR  COMMENTS:       PROCEDURE: KCT - KCT ABDOMEN/PELVIS WO  - Aug 25 2013 11:36AM     CLINICAL DATA:  Lower abdominal pain.  Diarrhea.  Ovarian cancer.    EXAM:  CT ABDOMEN AND PELVIS WITHOUT CONTRAST    TECHNIQUE:  Multidetector CT imaging of the abdomen and pelvis was performed  following the standard protocol without intravenous contrast.    COMPARISON:  None.  FINDINGS:  Small amount of contrast medium in the distal esophagus, suspicious  for gastroesophageal reflux.    The noncontrast CT appearance of the liver, spleen, pancreas, and  adrenal glands is within normal limits. Gallbladder contracted but  otherwise unremarkable. Mild wall thickening in the duodenum at the  junction of the 2nd and 3rd portions.    Infrarenal abdominal aortic ectasia, 2.8 cm. Aortoiliac  atherosclerotic vascular disease.    Orally administered  contrast extends through to the colon. No  dilated small bowel. There is wall thickening in the distal sigmoid  colonextending to the rectum, and considerable wall thickening near  the anorectal junction. Mild presacral stranding noted.    There is 6 mm of degenerative anterolisthesis at L4-5 with  associated central spinal stenosis.    Uterus absent. Clips are present along the pelvic sidewalls with low  grade stranding along the pelvic sidewalls. 1.1 x 0.7 cm filling  defect in the distal small bowel, image 56 of series 2, possibly a  small fatty polyp given the similar appearance on the prior exam  from 2010.    No omental nodularity or ascites.  Right hip implant noted.     IMPRESSION:  1. Wall thickening in the rectum and at the anus. Tumor and  inflammation are not excluded. Correlate with rectal exam.  2. Gastroesophageal reflux.  3. Mild wall thickening in proximally in the duodenum, query low  grade duodenitis.  4. Bilobed infrarenal abdominal aortic ectasia.  5. Lumbar spondylosis.  6. Possible small fatty polyp in the distal small bowel.      Electronically  Signed    By: Sherryl Barters M.D.    On: 08/25/2013 12:33         Verified By: Carron Curie, M.D.,  Nuclear Med:    10-Feb-15 09:50, PET/CT Scan Colorectal Cancer Initial Stage  PET/CT Scan Colorectal Cancer Initial Stage   REASON FOR EXAM:    rectal CA  COMMENTS:       PROCEDURE: PET - PET/CT INIT STAGING COLORECTAL  - Sep 30 2013  9:50AM     CLINICAL DATA:  Initial treatment strategy for staging of rectal  cancer. History of ovarian cancer.Marland Kitchen    EXAM:  NUCLEAR MEDICINEPET SKULL BASE TO THIGH    FASTING BLOOD GLUCOSE:  Value: 107 mg/dl    TECHNIQUE:  12.1 mCi F-18 FDG was injected intravenously. CT data was obtained  and used for attenuation correction and anatomic localization.    COMPARISON:  CT ABD-PELV W/O CM dated 08/25/2013    FINDINGS:  NECK    No areas of abnormal  hypermetabolism.    CHEST    No areas of abnormal hypermetabolism.    ABDOMEN/PELVIS  Hypermetabolism which corresponds to anorectal soft tissue fullness.  This measures a S.U.V. max of 12.2, including on images 215-221. No  perirectal hypermetabolism to suggest nodal disease. No other areas  of abnormal abdominal pelvic hypermetabolism.    SKELETON    No abnormal marrow activity.    CT IMAGES PERFORMED FOR ATTENUATION CORRECTION    Mucous retention cyst or polyp in the left maxillary sinus.  Hyperostosis frontalis interna. Carotid atherosclerosis.  Cardiomegaly and coronary artery atherosclerosis. Bilateral renal  atrophy. Borderline aneurysmal dilatation of the infrarenal  abdominal aorta at 3.0 cm. Abdominal pelvic findings deferred to  recent diagnostic CT. Pelvic node dissection. Right hip arthroplasty  with resultant beam hardening artifact. Hysterectomy. Osteopenia.     IMPRESSION:  1. Anorectal hypermetabolic primary.  2. No evidence of regional nodal or distant metastasis.  3. No evidence of metastatic disease from the patient's primary  ovarian carcinoma.      Electronically Signed    By: Abigail Miyamoto M.D.    On: 09/30/2013 12:43     Verified By: Areta Haber, M.D.,   Relevent Results:   Relevant Scans and Labs CT scan and PET CT scans were reviewed   Assessment and Plan: Impression:   locally advanced rectal cancer and 82 year old female with rectal incontinence and hematochezia. Plan:   I have discussed the case personally with Dr. Grayland Ormond. Would advocate palliative radiation therapy to her distal rectum up to 5000 cGy over 5 weeks. End points of treatment would be palliation to avoid total obstruction, bleeding, and pelvic rectal pain. Based on her prior history of prior external beam radiation we'll concentrate irradiation on the distal rectum corresponding to the areas of hypermetabolic activity on PET/CT scan. We'll opt not to treat her pelvic lymph  nodes at this time. Have also discuss chemotherapy based on her down stage not considered a candidate for systemic chemotherapy. I have set patient up for CT simulation early next week. Risks and benefits of treatment including possible diarrhea, possible skin reaction, possible alteration blood counts, partial stricture of the rectum, and risk of pre-treating an area which is probably not seen prior radiation from her previous treatments for ovarian cancer were all discussed in detail with the patient and her family. They all agree to go ahead with palliative treatment.  I would like to take this opportunity to thank you for allowing me  to continue to participate in this patient's care.  CC Referral:  cc: Dr. Hoy Morn   Electronic Signatures: Baruch Gouty, Roda Shutters (MD)  (Signed 12-Feb-15 14:59)  Authored: HPI, Diagnosis, Past Hx, PFSH, Allergies, Home Meds, ROS, Nursing Notes, Physical Exam, Other Results, Relevent Results, Encounter Assessment and Plan, CC Referring Physician   Last Updated: 12-Feb-15 14:59 by Armstead Peaks (MD)

## 2014-12-12 NOTE — Consult Note (Signed)
Chief Complaint:  Subjective/Chief Complaint She much better than yesterday. Reduced pain but still needs pain meds. Pt c/o of mild fatigue and abdominal dicomfort. She feel that things are improving slowly.   VITAL SIGNS/ANCILLARY NOTES: **Vital Signs.:   06-Sep-15 16:10  Vital Signs Type Q 4hr  Temperature Temperature (F) 97.6  Celsius 36.4  Pulse Pulse 93  Respirations Respirations 18  Systolic BP Systolic BP 379  Diastolic BP (mmHg) Diastolic BP (mmHg) 70  Mean BP 83  Pulse Ox % Pulse Ox % 94  Pulse Ox Activity Level  At rest  Oxygen Delivery Room Air/ 21 %  *Intake and Output.:   Shift 06-Sep-15 15:00  Grand Totals Intake:  0 Output:  0    Net:  0 24 Hr.:  0  Oral Intake      In:  0  Urine ml     Out:  0  Length of Stay Totals Intake:  5385 Output:  2300    Net:  3085   Brief Assessment:  GEN well developed, well nourished, no acute distress, obese   Cardiac Irregular  murmur present  + LE edema   Respiratory normal resp effort   Gastrointestinal Normal   Gastrointestinal details normal Soft  No masses palpable  Bowel sounds normal  No gaurding   Lab Results: Routine Hem:  06-Sep-15 05:22   Platelet Count (CBC) 288 (Result(s) reported on 26 Apr 2014 at 05:59AM.)   Radiology Results: Cardiology:    02-Sep-15 14:21, ECG  Ventricular Rate 125  Atrial Rate 104  QT 304  QTc 438  R Axis 0  T Axis -5  ECG interpretation   Atrial fibrillation with rapid ventricular response with premature ventricular or aberrantly conducted complexes  Low voltage QRS  Possible Inferior infarct (cited on or before 19-Jul-2009)  Abnormal ECG  When compared with ECG of 19-Jul-2009 17:41,  Atrial fibrillation has replaced Sinus rhythm  Vent. rate has increased BY  62 BPM  ST now depressed in Lateral leads  Confirmed by Joylene Wescott ([id number]) on 04/24/2014 2:21:11 PM    Overreader: Lujean Amel  ECG     03-Sep-15 08:56, Echo Doppler  Echo Doppler   REASON FOR EXAM:       COMMENTS:       PROCEDURE: [place] - ECHO DOPPLER COMPLETE(TRANSTHOR)  - Apr 23 2014  8:56AM     RESULT: Echocardiogram Report    Patient Name:   [name] Date of Exam: 04/23/2014  Medical Rec #:  [id number]            Custom1:  Date of Birth:  March 11, 1920         Height:  Patient Age:    81 years          Weight:       125.0 lb  Patient Gender: F                 BSA:          1.48 m??    Indications: Atrial Fib  Sonographer:    Sherrie Sport RDCS  Referring Phys: Rochel Brome, J    Summary:   1. Left ventricular ejection fraction, by visual estimation, is 60 to   65%.   2. Normal global left ventricular systolic function.   3. Decreased left ventricular internal cavity size.   4. Mild to moderate mitral valve regurgitation.  5. Mild tricuspid regurgitation.  2D AND M-MODE MEASUREMENTS (normal ranges within parentheses):  Left  Ventricle:          Normal  IVSd (2D):      1.11 cm (0.7-1.1)  LVPWd (2D):     0.98 cm (0.7-1.1) Aorta/LA:                  Normal  LVIDd (2D):3.43 cm (3.4-5.7) Aortic Root (2D): 2.80 cm (2.4-3.7)  LVIDs (2D):     2.25 cm           Left Atrium (2D): 4.60 cm (1.9-4.0)  LV FS (2D):     34.4 %   (>25%)  LV EF (2D):     64.6 %   (>50%)                                    Right Ventricle:                             RVd (2D):        0.72 cm  LV DIASTOLIC FUNCTION:  MV Peak E: 1.19 m/s E/e' Ratio: 8.30                      Decel Time: 180 msec  SPECTRAL DOPPLER ANALYSIS (where applicable):  Mitral Valve:  MV P1/2 Time: 52.20 msec  MV Area, PHT: 4.21 cm??  Aortic Valve: AoV Max Vel: 1.35 m/s AoV Peak PG: 7.2 mmHg AoV Mean PG:  LVOT Vmax: 1.00 m/s LVOT VTI:  LVOT Diameter: 2.00 cm  AoV Area, Vmax: 2.33 cm?? AoV Area, VTI:  AoV Area, Vmn:  Tricuspid Valve and PA/RV Systolic Pressure: TR Max Velocity: 2.53 m/s RA     Pressure: 5 mmHg RVSP/PASP: 30.6 mmHg  Pulmonic Valve:  PV Max Velocity: 0.76 m/s PV Max PG: 2.3 mmHg PV Mean PG:    PHYSICIAN  INTERPRETATION:  Left Ventricle: The left ventricular internal cavity size was decreased.   LVseptal wall thickness was normal. LV posterior wall thickness was   normal. Global LV systolic function was normal. Left ventricular ejection   fraction, by visual estimation, is 60 to 65%.  Right Ventricle: The right ventricular size is normal. Global RV systolic   function is normal.  Left Atrium: The left atrium is normal in size.  Right Atrium: The right atrium is normal in size.  Pericardium: There is no evidence of pericardial effusion.  Mitral Valve: The mitral valve is normal in structure. Mild to moderate     mitral valve regurgitation is seen.  Tricuspid Valve: Mild tricuspid regurgitation is visualized. The   tricuspid regurgitant velocity is 2.53 m/s, and with an assumed right   atrial pressure of 5 mmHg, the estimated right ventricular systolic   pressure is normal at 30.6 mmHg.  Aortic Valve: The aortic valve is normal. No evidence of aortic valve   regurgitation is seen.  Pulmonic Valve: The pulmonic valve is normal.    [place] MD  Electronically signed by Franco Nones MD  Signature Date/Time: 04/23/2014/5:33:43 PM    *** Final ***  IMPRESSION: .        Verified By: Yolonda Kida, M.D., MD   Assessment/Plan:  Assessment/Plan:  Assessment IMP Weakness AFIB Obesity Recto-Vag fistula Abd discomfort HTN .   Plan PLAN Advance diet as tolerated Metoprolol for rate control Advance diet as tolerated Consider PT/OT Increase activity slowly Bp control Medical therapy  for now DVT prophylaxis   Electronic Signatures: Lujean Amel D (MD)  (Signed 07-Sep-15 07:55)  Authored: Chief Complaint, VITAL SIGNS/ANCILLARY NOTES, Brief Assessment, Lab Results, Radiology Results, Assessment/Plan   Last Updated: 07-Sep-15 07:55 by Yolonda Kida (MD)

## 2014-12-12 NOTE — Consult Note (Signed)
Chief Complaint:  Subjective/Chief Complaint Pt c/o of mild fatigue and abdominal dicomfort. She feel that things are improving slowly.   VITAL SIGNS/ANCILLARY NOTES: **Vital Signs.:   05-Sep-15 12:28  Vital Signs Type Pre Medication  Pulse Pulse 100  Pulse source if not from Vital Sign Device per cardiac monitor  Systolic BP Systolic BP 017  Diastolic BP (mmHg) Diastolic BP (mmHg) 71  Mean BP 90  *Intake and Output.:   05-Sep-15 11:30  Grand Totals Intake:  346 Output:      Net:  346 55 Hr.:  657  Dietary Restrictions  NPO  IV (Primary)      In:  346   Brief Assessment:  GEN well developed, well nourished, no acute distress, obese   Cardiac Irregular  murmur present  + LE edema   Respiratory normal resp effort   Gastrointestinal Normal   Gastrointestinal details normal Soft  No masses palpable  Bowel sounds normal  No gaurding   Lab Results: LabObservation:  03-Sep-15 08:56   OBSERVATION Reason for Test  Cardiology:  03-Sep-15 08:56   Echo Doppler REASON FOR EXAM:     COMMENTS:     PROCEDURE: [facility] - ECHO DOPPLER COMPLETE(TRANSTHOR)  - Apr 23 2014  8:56AM   RESULT: Echocardiogram Report  Patient Name:   [name] Date of Exam: 04/23/2014 Medical Rec #:  [id number]            Custom1: Date of Birth:  Jun 03, 1920         Height: Patient Age:    81 years          Weight:       125.0 lb Patient Gender: F                 BSA:          1.48 m??  Indications: Atrial Fib Sonographer:    Sherrie Sport RDCS Referring Phys: Rochel Brome, J  Summary:  1. Left ventricular ejection fraction, by visual estimation, is 60 to  65%.  2. Normal global left ventricular systolic function.  3. Decreased left ventricular internal cavity size.  4. Mild to moderate mitral valve regurgitation. 5. Mild tricuspid regurgitation. 2D AND M-MODE MEASUREMENTS (normal ranges within parentheses): Left Ventricle:          Normal IVSd (2D):      1.11 cm (0.7-1.1) LVPWd (2D):     0.98 cm  (0.7-1.1) Aorta/LA:                  Normal LVIDd (2D):3.43 cm (3.4-5.7) Aortic Root (2D): 2.80 cm (2.4-3.7) LVIDs (2D):     2.25 cm           Left Atrium (2D): 4.60 cm (1.9-4.0) LV FS (2D):     34.4 %   (>25%) LV EF (2D):     64.6 %   (>50%)                                   Right Ventricle:                            RVd (2D):        0.09 cm LV DIASTOLIC FUNCTION: MV Peak E: 1.19 m/s E/e' Ratio: 8.30  Decel Time: 180 msec SPECTRAL DOPPLER ANALYSIS (where applicable): Mitral Valve: MV P1/2 Time: 52.20 msec MV Area, PHT: 4.21 cm?? Aortic Valve: AoV Max Vel: 1.35 m/s AoV Peak PG: 7.2 mmHg AoV Mean PG: LVOT Vmax: 1.00 m/s LVOT VTI:  LVOT Diameter: 2.00 cm AoV Area, Vmax: 2.33 cm?? AoV Area, VTI:  AoV Area, Vmn: Tricuspid Valve and PA/RV Systolic Pressure: TR Max Velocity: 2.53 m/s RA   Pressure: 5 mmHg RVSP/PASP: 30.6 mmHg Pulmonic Valve: PV Max Velocity: 0.76 m/s PV Max PG: 2.3 mmHg PV Mean PG:  PHYSICIAN INTERPRETATION: Left Ventricle: The left ventricular internal cavity size was decreased.  LVseptal wall thickness was normal. LV posterior wall thickness was  normal. Global LV systolic function was normal. Left ventricular ejection  fraction, by visual estimation, is 60 to 65%. Right Ventricle: The right ventricular size is normal. Global RV systolic  function is normal. Left Atrium: The left atrium is normal in size. Right Atrium: The right atrium is normal in size. Pericardium: There is no evidence of pericardial effusion. Mitral Valve: The mitral valve is normal in structure. Mild to moderate   mitral valve regurgitation is seen. Tricuspid Valve: Mild tricuspid regurgitation is visualized. The  tricuspid regurgitant velocity is 2.53 m/s, and with an assumed right  atrial pressure of 5 mmHg, the estimated right ventricular systolic  pressure is normal at 30.6 mmHg. Aortic Valve: The aortic valve is normal. No evidence of aortic valve  regurgitation is  seen. Pulmonic Valve: The pulmonic valve is normal.  [place] MD Electronically signed by Franco Nones MD Signature Date/Time: 04/23/2014/5:33:43 PM  *** Final *** IMPRESSION: .    Verified By: Yolonda Kida, M.D., MD  Routine Chem:  03-Sep-15 04:09   Glucose, Serum 96  BUN 18  Creatinine (comp) 1.14  Sodium, Serum 137  Potassium, Serum 3.9  CO2, Serum 30  Calcium (Total), Serum 8.6  Anion Gap 7  Osmolality (calc) 276  eGFR (African American)  48  eGFR (Non-African American)  42 (eGFR values <38m/min/1.73 m2 may be an indication of chronic kidney disease (CKD). Calculated eGFR is useful in patients with stable renal function. The eGFR calculation will not be reliable in acutely ill patients when serum creatinine is changing rapidly. It is not useful in  patients on dialysis. The eGFR calculation may not be applicable to patients at the low and high extremes of body sizes, pregnant women, and vegetarians.)  Routine Hem:  03-Sep-15 04:09   Platelet Count (CBC) 327  WBC (CBC) 11.0  RBC (CBC)  3.33  Hemoglobin (CBC)  10.1  Hematocrit (CBC)  31.2  MCV 94  MCH 30.4  MCHC 32.4  RDW  15.6  Neutrophil % 74.7  Lymphocyte % 16.7  Monocyte % 7.0  Eosinophil % 0.9  Basophil % 0.7  Neutrophil #  8.2  Lymphocyte # 1.8  Monocyte # 0.8  Eosinophil # 0.1  Basophil # 0.1 (Result(s) reported on 23 Apr 2014 at 05:04AM.)   Radiology Results: Cardiology:    02-Sep-15 14:21, ECG  Ventricular Rate 125  Atrial Rate 104  QRS Duration 74  QT 304  QTc 438  R Axis 0  T Axis -5  ECG interpretation   Atrial fibrillation with rapid ventricular response with premature ventricular or aberrantly conducted complexes  Low voltage QRS  Possible Inferior infarct (cited on or before 19-Jul-2009)  Abnormal ECG  When compared with ECG of 19-Jul-2009 17:41,  Atrial fibrillation has replaced Sinus rhythm  Vent. rate has  increased BY  62 BPM  ST now depressed in  Lateral leads  Confirmed by CALLWOOD, DWAYNE ([id number]) on 04/24/2014 2:21:11 PM    Overreader: Lujean Amel  ECG     03-Sep-15 08:56, Echo Doppler  Echo Doppler   REASON FOR EXAM:      COMMENTS:       PROCEDURE: [place] - ECHO DOPPLER COMPLETE(TRANSTHOR)  - Apr 23 2014  8:56AM     RESULT: Echocardiogram Report    Patient Name:   [name] Date of Exam: 04/23/2014  Medical Rec #:  [id number]            Custom1:  Date of Birth:  [birth year]         Height:  Patient Age:    81 years          Weight:       125.0 lb  Patient Gender: F                 BSA:          1.48 m??    Indications: Atrial Fib  Sonographer:    Sherrie Sport RDCS  Referring Phys: Rochel Brome, J    Summary:   1. Left ventricular ejection fraction, by visual estimation, is 60 to   65%.   2. Normal global left ventricular systolic function.   3. Decreased left ventricular internal cavity size.   4. Mild to moderate mitral valve regurgitation.  5. Mild tricuspid regurgitation.  2D AND M-MODE MEASUREMENTS (normal ranges within parentheses):  Left Ventricle:          Normal  IVSd (2D):      1.11 cm (0.7-1.1)  LVPWd (2D):     0.98 cm (0.7-1.1) Aorta/LA:                  Normal  LVIDd (2D):3.43 cm (3.4-5.7) Aortic Root (2D): 2.80 cm (2.4-3.7)  LVIDs (2D):     2.25 cm           Left Atrium (2D): 4.60 cm (1.9-4.0)  LV FS (2D):     34.4 %   (>25%)  LV EF (2D):     64.6 %   (>50%)                                    Right Ventricle:                             RVd (2D):        7.82 cm  LV DIASTOLIC FUNCTION:  MV Peak E: 1.19 m/s E/e' Ratio: 8.30                      Decel Time: 180 msec  SPECTRAL DOPPLER ANALYSIS (where applicable):  Mitral Valve:  MV P1/2 Time: 52.20 msec  MV Area, PHT: 4.21 cm??  Aortic Valve: AoV Max Vel: 1.35 m/s AoV Peak PG: 7.2 mmHg AoV Mean PG:  LVOT Vmax: 1.00 m/s LVOT VTI:  LVOT Diameter: 2.00 cm  AoV Area, Vmax: 2.33 cm?? AoV Area, VTI:  AoV Area, Vmn:  Tricuspid Valve and PA/RV Systolic  Pressure: TR Max Velocity: 2.53 m/s RA     Pressure: 5 mmHg RVSP/PASP: 30.6 mmHg  Pulmonic Valve:  PV Max Velocity: 0.76 m/s PV Max PG: 2.3 mmHg PV Mean PG:  PHYSICIAN INTERPRETATION:  Left Ventricle: The left ventricular internal cavity size was decreased.   LVseptal wall thickness was normal. LV posterior wall thickness was   normal. Global LV systolic function was normal. Left ventricular ejection   fraction, by visual estimation, is 60 to 65%.  Right Ventricle: The right ventricular size is normal. Global RV systolic   function is normal.  Left Atrium: The left atrium is normal in size.  Right Atrium: The right atrium is normal in size.  Pericardium: There is no evidence of pericardial effusion.  Mitral Valve: The mitral valve is normal in structure. Mild to moderate     mitral valve regurgitation is seen.  Tricuspid Valve: Mild tricuspid regurgitation is visualized. The   tricuspid regurgitant velocity is 2.53 m/s, and with an assumed right   atrial pressure of 5 mmHg, the estimated right ventricular systolic   pressure is normal at 30.6 mmHg.  Aortic Valve: The aortic valve is normal. No evidence of aortic valve   regurgitation is seen.  Pulmonic Valve: The pulmonic valve is normal.    [place] MD  Electronically signed by Franco Nones MD  Signature Date/Time: 04/23/2014/5:33:43 PM    *** Final ***  IMPRESSION: .        Verified By: Yolonda Kida, M.D., MD   Assessment/Plan:  Assessment/Plan:  Assessment IMP AFIB Obesity Recto-Vag fistula Abd discomfor HTN .   Plan PLAN Metoprololfor rate control Advance diet as tolerated Consider PT/OT Increase activity slowly Bp control Medical therapy for now DVT prophylaxis   Electronic Signatures: Lujean Amel D (MD)  (Signed 06-Sep-15 07:46)  Authored: Chief Complaint, VITAL SIGNS/ANCILLARY NOTES, Brief Assessment, Lab Results, Radiology Results, Assessment/Plan   Last Updated:  06-Sep-15 07:46 by Lujean Amel D (MD)

## 2014-12-12 NOTE — Discharge Summary (Signed)
PATIENT NAME:  [name], [name] MR#:  [id number] DATE OF BIRTH:  [birth year]  DATE OF ADMISSION:  04/24/2014 DATE OF DISCHARGE:  05/04/2014  HISTORY OF PRESENT ILLNESS: This 81 year old female was admitted emergently on the surgery service. She had recently presented to the office with a chief complaint of anal pain. She was referred by Dr. Grayland Ormond. She has a history of rectal cancer, which was treated primarily with radiation therapy and completed radiation in April, recently developed a feculent vaginal discharge and having continued feculent drainage from the vagina and from the rectum with associated pain. Also, she has had urinary incontinence. She had recent CT findings of rectovaginal fistula.   PAST MEDICAL HISTORY: 1. Includes a distant history of atrial fibrillation, which was transient.  2. Hyperlipidemia.  3. Hypertension.  4. Aortic ectasia.  5. Lumbar spondylosis.  6. Duodenitis.   PAST SURGICAL HISTORY: 1. Hysterectomy with bilateral salpingo-oophorectomy.  2. Appendectomy.  3. History of exploratory laparotomy for bowel obstruction with lysis of adhesions.  4. History of hip fracture with hemiarthroplasty.   SOCIAL HISTORY: She is married and accompanied by her family.   REVIEW OF SYSTEMS: She reports no difficulty breathing. No chest pains. When she presented to the same day surgery center for preadmission testing, she was found to be tachycardic and was admitted emergently due to tachycardia.   PHYSICAL EXAMINATION:  VITAL SIGNS: On the initial surgery examination, her blood pressure was 133/93, pulse rate was 67. This was prior to the onset of tachycardia.  LUNGS: At the time of surgical consultation, her lung sounds were clear.  HEART: Was with an irregular rhythm at that time, pulse rate 67.  ABDOMEN: Soft and flat and nontender with no palpable mass.  RECTAL AND VAGINAL: Examination demonstrated rectovaginal fistula. There was evidence of inflammation of the skin  surrounding the vagina.   CLINICAL DATA: Recent laboratory work on August 24, glucose 105, creatinine 1.15, albumin was low at 2.7, hemoglobin 10.5.   HOSPITAL COURSE:  The patient was admitted emergently with tachycardia and consulted Dr. Saralyn Pilar, who treated her with diltiazem 60 mg q.8 h. and also metoprolol 25 mg q.6 h.   Following admission, she was monitored with telemetry and continued to have some intermittent tachycardia, but was improved.   She was later carried to the operating room on September 4th, had laparoscopy with lysis of adhesions and creation of a loop sigmoid colostomy. She did have a preoperative prophylactic antibiotic. She also had prophylactic subcutaneous heparin.   Postoperatively, she was continued on telemetry, which was off unit telemetry. Continued to have occasional tachycardia, continued to have atrial fibrillation. She has progressed satisfactorily with eating and walking in the hall, has had multiple bowel movements per colostomy. Does have a red rubber catheter bridge.   Her admission hemoglobin on September 3rd was 10.1. Her creatinine was 1.14.   FINAL DIAGNOSES: 1. Carcinoma of the rectum.  2. Rectovaginal fistula.  3. Atrial fibrillation with rapid ventricular response.   Plan for discharge to be transferred to [facility] for skilled nursing care and routine colostomy care.   DISCHARGE MEDICATIONS: Will include diltiazem 60 mg q.8 h., metoprolol tartrate 50 mg q.12 h., docusate sodium 100 mg daily, acetaminophen as needed for moderate pain, Norco as needed for moderate pain.   PLAN:  1. Follow up in the office in 2 weeks.  2. Also anticipate subsequent follow-up oncology care.    ____________________________ J. Rochel Brome, MD jws:JT D: 05/04/2014 10:11:00 ET T: 05/04/2014 10:56:12  ET JOB#: [id number]  cc: Loreli Dollar, MD, <Dictator> Loreli Dollar MD ELECTRONICALLY SIGNED 05/05/2014 19:43

## 2014-12-12 NOTE — Consult Note (Signed)
PATIENT NAME:  [name], [name] MR#:  [id number] DATE OF BIRTH:  [birth year]  DATE OF CONSULTATION:  04/22/2014  CONSULTING PHYSICIAN:  Isaias Cowman, MD  PRIMARY CARE PHYSICIAN: [name], MD  CHIEF COMPLAINT: "I have a rectovaginal fistula."   HISTORY OF PRESENT ILLNESS: The patient is a 81 year old female with history of hypertension, who has had a history of rectal cancer status post radiation therapy. The patient has had a recent history of anorectal and vaginal pain as well as passing stool from her vagina. The patient had a CT scan, which demonstrated a rectovaginal fistula and the patient is scheduled for surgery on 04/25/2014.  The patient was undergoing preoperative evaluation when she was noted to be in atrial fibrillation at a rate of 110 beats per minute. The patient is asymptomatic, without chest pain, shortness of breath, palpitations or heart racing. She does have some mild peripheral edema.   PAST MEDICAL HISTORY: 1.  The patient had an episode of atrial fibrillation when she had a small-bowel obstruction approximately 8 years ago.  2.  Hypertension.  3.  Hyperlipidemia.  4.  Aortic ectasia.   MEDICATIONS: Pravastatin 20 mg at bedtime, furosemide 20 mg daily, atenolol 25 mg daily, cyanocobalamin 5000 mg daily, docusate 100 mg daily, ferrous sulfate 325 mg daily, hydrocodone 1 tablet t.i.d. p.r.n., omeprazole 20 mg daily, potassium chloride 10 mEq daily.   SOCIAL HISTORY: The patient currently lives in a retirement home. She denies tobacco abuse.   FAMILY HISTORY: Father with history of myocardial infarction.   REVIEW OF SYSTEMS:  CONSTITUTIONAL: No fever or chills.  EYES: No blurry vision.  EARS: No hearing loss.  RESPIRATORY: No shortness of breath.  CARDIOVASCULAR: No chest pain.  GASTROINTESTINAL: The patient has had abdominal discomfort.  GENITOURINARY: The patient has a rectovaginal fistula, as described above.  MUSCULOSKELETAL: No arthralgias or  myalgias.  NEUROLOGICAL: No focal muscle weakness or numbness.  PSYCHOLOGICAL: No depression or anxiety.   PHYSICAL EXAMINATION: VITAL SIGNS: Heart rate is 110 and 120 beats per minute. Blood pressure 90/60.  HEENT: Pupils equal, reactive to light and accommodation.  NECK: Supple without thyromegaly.  LUNGS: Clear.  HEART: Normal JVP. Normal PMI. Irregularly regular rhythm. Normal S1, S2. No appreciable gallop, murmur, or rub.  ABDOMEN: Soft, nontender.  EXTREMITIES: Pulses were intact bilaterally.  MUSCULOSKELETAL: Normal muscle tone.  NEUROLOGIC: The patient is alert and oriented x3. Motor and sensory both grossly intact.   IMPRESSION: A 81 year old female with rectovaginal fistula, recent history of poor nutrition, probable dehydration with atrial fibrillation with a rapid ventricular rate, though the patient currently appears to be asymptomatic.   RECOMMENDATIONS: 1.  IV hydration. 2.  We will continue beta-blocker with metoprolol 25 mg b.i.d.  3.  A 2-D echocardiogram to assess left ventricular function.  4.  Plan to proceed with surgery for a rectovaginal fistula as planned.   ____________________________ Isaias Cowman, MD ap:lr D: 04/22/2014 16:35:54 ET T: 04/22/2014 17:06:06 ET JOB#: [id number]  cc: Isaias Cowman, MD, <Dictator> Isaias Cowman MD ELECTRONICALLY SIGNED 04/23/2014 16:02

## 2014-12-12 NOTE — H&P (Signed)
PATIENT NAME:  [name], [name] MR#:  [id number] DATE OF BIRTH:  April 06, 1920  DATE OF ADMISSION:  05/14/2014  HISTORY OF PRESENT ILLNESS: This 81 year old female with rectal cancer has had radiation and has developed rectovaginal fistula. She had a recent admission to the hospital with atrial fibrillation with rapid ventricular response and then had laparoscopy and creation of a loop sigmoid colostomy. She was seen in the office 3 days ago and appeared to be making good progress. Her colostomy has been draining stool and she had had no recent vaginal discharge.   Today, our office was called to report that there was stool coming out of her vagina, and she was  directed to the Emergency Room. On further questioning in the Emergency Room, the family reports that this morning the bag became full of stool and gas and was tense and needed to be emptied and, soon thereafter, began to have some stool coming out of her vagina, Otherwise, she has recently had some anorexia, but has been eating a regular diet. She has been walking in the hallway and having physical therapy.  She has been taking her medicine for atrial fibrillation and tolerating well.   PHYSICAL EXAMINATION:  VITAL SIGNS: Pulse rate is 122 and irregular.  GENERAL:  She appears to be in no acute distress.  ABDOMEN: Soft and flat and nontender. Her laparoscopic incisions are well-healed. The colostomy was examined and could put a finger into the efferent and the afferent loop of the loop colostomy, and this appears to be gradually healing.   IMPRESSION:  1.  Rectal cancer with rectovaginal fistula.  2.  Status post creation of loop sigmoid colostomy.   COMMENT:  It appears that this morning, the bag became completely full, so that no stool could enter the bag and it is likely, at that time, that stool was diverted down into the distal sigmoid colon and rectum.   I have handwritten some instructions for [place] to use Karaya paste in a  skin fold adjacent to the colostomy and also apply benzoin to help the bag stick and empty the bag frequently to prevent it from becoming distended with stool and gas. She does have plan for a followup office appointment at a later date.    ____________________________ J. Rochel Brome, MD jws:lr D: 05/14/2014 19:09:01 ET T: 05/14/2014 20:28:28 ET JOB#: [id number]  cc: Loreli Dollar, MD, <Dictator> [place] MD ELECTRONICALLY SIGNED 05/15/2014 18:00

## 2014-12-16 ENCOUNTER — Ambulatory Visit
Admit: 2014-12-16 | Disposition: A | Discharge status: Home / Self Care | Payer: Self-pay | Attending: Oncology | Admitting: Oncology

## 2014-12-16 LAB — CBC CANCER CENTER
BASOS PCT: 0.9 %
Basophil #: 0.1 x10 3/mm (ref 0.0–0.1)
Eosinophil #: 0.1 x10 3/mm (ref 0.0–0.7)
Eosinophil %: 1.4 %
HCT: 36.4 % (ref 35.0–47.0)
HGB: 12.4 g/dL (ref 12.0–16.0)
Lymphocyte #: 1.8 x10 3/mm (ref 1.0–3.6)
Lymphocyte %: 20 %
MCH: 29.7 pg (ref 26.0–34.0)
MCHC: 34 g/dL (ref 32.0–36.0)
MCV: 87 fL (ref 80–100)
MONOS PCT: 8.5 %
Monocyte #: 0.8 x10 3/mm (ref 0.2–0.9)
NEUTROS ABS: 6.3 x10 3/mm (ref 1.4–6.5)
Neutrophil %: 69.2 %
Platelet: 240 x10 3/mm (ref 150–440)
RBC: 4.17 10*6/uL (ref 3.80–5.20)
RDW: 15.8 % — ABNORMAL HIGH (ref 11.5–14.5)
WBC: 9.1 x10 3/mm (ref 3.6–11.0)

## 2014-12-16 LAB — CEA

## 2015-03-18 ENCOUNTER — Other Ambulatory Visit: Payer: Self-pay | Admitting: *Deleted

## 2015-03-18 MED ORDER — HYDROCORTISONE ACETATE 25 MG RE SUPP: 25.0000 mg | Freq: Two times a day (BID) | RECTAL | Status: DC | PRN

## 2015-04-16 ENCOUNTER — Other Ambulatory Visit: Payer: Self-pay | Admitting: Oncology

## 2015-04-16 DIAGNOSIS — C20 Malignant neoplasm of rectum: Secondary | ICD-10-CM

## 2015-04-19 ENCOUNTER — Inpatient Hospital Stay: Payer: Medicare Other | Attending: Oncology

## 2015-04-19 ENCOUNTER — Ambulatory Visit: Payer: Self-pay | Admitting: Oncology

## 2015-04-19 ENCOUNTER — Encounter: Payer: Self-pay | Admitting: Oncology

## 2015-04-19 ENCOUNTER — Inpatient Hospital Stay (HOSPITAL_BASED_OUTPATIENT_CLINIC_OR_DEPARTMENT_OTHER): Payer: Medicare Other | Admitting: Oncology

## 2015-04-19 VITALS — BP 166/78 | HR 82 | Temp 95.1°F | Resp 16 | Wt 131.6 lb

## 2015-04-19 DIAGNOSIS — C20 Malignant neoplasm of rectum: Secondary | ICD-10-CM

## 2015-04-19 DIAGNOSIS — R531 Weakness: Secondary | ICD-10-CM | POA: Insufficient documentation

## 2015-04-19 DIAGNOSIS — N189 Chronic kidney disease, unspecified: Secondary | ICD-10-CM

## 2015-04-19 DIAGNOSIS — K219 Gastro-esophageal reflux disease without esophagitis: Secondary | ICD-10-CM | POA: Insufficient documentation

## 2015-04-19 DIAGNOSIS — R5381 Other malaise: Secondary | ICD-10-CM | POA: Insufficient documentation

## 2015-04-19 DIAGNOSIS — Z8542 Personal history of malignant neoplasm of other parts of uterus: Secondary | ICD-10-CM | POA: Diagnosis not present

## 2015-04-19 DIAGNOSIS — R5383 Other fatigue: Secondary | ICD-10-CM | POA: Diagnosis not present

## 2015-04-19 DIAGNOSIS — R300 Dysuria: Secondary | ICD-10-CM

## 2015-04-19 DIAGNOSIS — I129 Hypertensive chronic kidney disease with stage 1 through stage 4 chronic kidney disease, or unspecified chronic kidney disease: Secondary | ICD-10-CM | POA: Insufficient documentation

## 2015-04-19 DIAGNOSIS — Z79899 Other long term (current) drug therapy: Secondary | ICD-10-CM | POA: Insufficient documentation

## 2015-04-19 DIAGNOSIS — R970 Elevated carcinoembryonic antigen [CEA]: Secondary | ICD-10-CM | POA: Diagnosis not present

## 2015-04-19 DIAGNOSIS — N823 Fistula of vagina to large intestine: Secondary | ICD-10-CM

## 2015-04-19 DIAGNOSIS — Z800 Family history of malignant neoplasm of digestive organs: Secondary | ICD-10-CM | POA: Insufficient documentation

## 2015-04-19 DIAGNOSIS — E785 Hyperlipidemia, unspecified: Secondary | ICD-10-CM | POA: Insufficient documentation

## 2015-04-19 DIAGNOSIS — Z933 Colostomy status: Secondary | ICD-10-CM

## 2015-04-19 DIAGNOSIS — Z9889 Other specified postprocedural states: Secondary | ICD-10-CM | POA: Insufficient documentation

## 2015-04-19 DIAGNOSIS — R197 Diarrhea, unspecified: Secondary | ICD-10-CM | POA: Insufficient documentation

## 2015-04-19 LAB — CBC WITH DIFFERENTIAL/PLATELET
Basophils Absolute: 0.1 10*3/uL (ref 0–0.1)
Basophils Relative: 1 %
EOS ABS: 0.1 10*3/uL (ref 0–0.7)
Eosinophils Relative: 1 %
HEMATOCRIT: 38.6 % (ref 35.0–47.0)
HEMOGLOBIN: 13.1 g/dL (ref 12.0–16.0)
LYMPHS ABS: 1.8 10*3/uL (ref 1.0–3.6)
Lymphocytes Relative: 13 %
MCH: 29.6 pg (ref 26.0–34.0)
MCHC: 33.8 g/dL (ref 32.0–36.0)
MCV: 87.5 fL (ref 80.0–100.0)
MONOS PCT: 7 %
Monocytes Absolute: 0.9 10*3/uL (ref 0.2–0.9)
NEUTROS ABS: 10.4 10*3/uL — AB (ref 1.4–6.5)
Neutrophils Relative %: 78 %
Platelets: 290 10*3/uL (ref 150–440)
RBC: 4.41 MIL/uL (ref 3.80–5.20)
RDW: 15.7 % — ABNORMAL HIGH (ref 11.5–14.5)
WBC: 13.3 10*3/uL — ABNORMAL HIGH (ref 3.6–11.0)

## 2015-04-20 LAB — CEA: CEA: 16.5 ng/mL — ABNORMAL HIGH (ref 0.0–4.7)

## 2015-04-24 NOTE — Progress Notes (Signed)
[place]  [phone] [phone]  ID: [name] OB: October 07, 1919  MR#: [id number]  VOH#:[id number]  Patient Care Team: [name], MD as PCP - General (Family Medicine)  CHIEF COMPLAINT:  Chief Complaint  Patient presents with  . Follow-up    rectal cancer    INTERVAL HISTORY: Patient return returns to clinic today for routine evaluation. She currently feels well. Her pain is better controlled. She denies any recent fevers. She has no neurologic complaints.  She denies any chest pain or shortness of breath.  She denies any nausea, vomiting, or constipation.  She denies any melena or hematochezia. Patient offers no further specific complaints today.  REVIEW OF SYSTEMS:   Review of Systems  Constitutional: Positive for malaise/fatigue. Negative for fever.  Respiratory: Negative.   Cardiovascular: Negative.   Gastrointestinal: Positive for diarrhea.  Genitourinary: Positive for dysuria.  Neurological: Positive for weakness.    As per HPI. Otherwise, a complete review of systems is negatve.  PAST MEDICAL HISTORY: Past Medical History  Diagnosis Date  . Hyperlipidemia   . Hypertension   . Aortic ectasia   . Lumbar spondylosis   . Duodenitis   . Colovaginal fistula   . Malignant neoplasm of rectum   . Anemia   . Endometrial cancer   . GERD (gastroesophageal reflux disease)   . Chronic renal insufficiency     PAST SURGICAL HISTORY: Past Surgical History  Procedure Laterality Date  . Appendectomy    . Abdominal hysterectomy      FAMILY HISTORY Family History  Problem Relation Age of Onset  . Diabetes type II Mother   . Coronary artery disease Father   . Tuberculosis Father   . Diabetes type II Brother   . Liver cancer Son   . Colon cancer Son   . Coronary artery disease Brother   . Breast cancer Daughter        ADVANCED DIRECTIVES:    HEALTH MAINTENANCE: Social History  Substance Use Topics  . Smoking  status: Never Smoker   . Smokeless tobacco: Never Used  . Alcohol Use: No     Colonoscopy:  PAP:  Bone density:  Lipid panel:  Allergies  Allergen Reactions  . Sulfa Antibiotics Rash  . Meperidine Other (See Comments)    Very sick per pt    Current Outpatient Prescriptions  Medication Sig Dispense Refill  . acetaminophen (TYLENOL) 325 MG tablet Take by mouth.    . Cyanocobalamin (B-12) 5000 MCG SUBL Place under the tongue.    . diltiazem (DILACOR XR) 120 MG 24 hr capsule     . docusate sodium (STOOL SOFTENER) 100 MG capsule Take by mouth.    . ferrous sulfate 325 (65 FE) MG tablet Take by mouth.    . furosemide (LASIX) 20 MG tablet Take by mouth.    . hydrocortisone (ANUSOL-HC) 25 MG suppository Place rectally.    . metoprolol succinate (TOPROL-XL) 50 MG 24 hr tablet     . omeprazole (PRILOSEC) 20 MG capsule     . potassium chloride (K-DUR) 10 MEQ tablet Take by mouth.    . pravastatin (PRAVACHOL) 20 MG tablet TAKE 1 TABLET NIGHTLY    . promethazine (PHENERGAN) 25 MG suppository Place rectally.    Marland Kitchen zolpidem (AMBIEN) 5 MG tablet Take by mouth.     No current facility-administered medications for this visit.    OBJECTIVE: Filed Vitals:   04/19/15 1540  BP: 166/78  Pulse: 82  Temp: 95.1 F (35.1  C)  Resp: 16     There is no height on file to calculate BMI.    ECOG FS:1 - Symptomatic but completely ambulatory  General: Well-developed, well-nourished, no acute distress. Eyes: Pink conjunctiva, anicteric sclera. Lungs: Clear to auscultation bilaterally. Heart: Regular rate and rhythm. No rubs, murmurs, or gallops. Abdomen: Soft, nontender, nondistended. No organomegaly noted, normoactive bowel sounds. Colostomy in left lower quadrant. Musculoskeletal: No edema, cyanosis, or clubbing. Neuro: Alert, answering all questions appropriately. Cranial nerves grossly intact. Skin: No rashes or petechiae noted. Psych: Normal affect.   LAB RESULTS:  Lab Results  Component  Value Date   NA 139 09/10/2014   K 3.5 09/10/2014   CL 102 09/10/2014   CO2 27 09/10/2014   GLUCOSE 112* 09/10/2014   BUN 17 09/10/2014   CREATININE 1.17 09/10/2014   CALCIUM 8.8 09/10/2014   PROT 7.3 09/10/2014   ALBUMIN 3.5 09/10/2014   AST 13* 09/10/2014   ALT 14 09/10/2014   ALKPHOS 132* 09/10/2014   BILITOT 0.9 09/10/2014   GFRNONAA 46* 09/10/2014   GFRAA 56* 09/10/2014    Lab Results  Component Value Date   WBC 13.3* 04/19/2015   NEUTROABS 10.4* 04/19/2015   HGB 13.1 04/19/2015   HCT 38.6 04/19/2015   MCV 87.5 04/19/2015   PLT 290 04/19/2015     STUDIES: No results found.  ASSESSMENT: Stage IIa adenocarcinoma of the rectum.  PLAN:   1.  Rectal cancer: Previously, CT scan results reviewed independently and noted with possible residual disease. Given patient's age and performance status, it was determined that no further chemotherapy would be used. CEA is slowly increasing from 6.6 in January to 16.5 currently.  Plan on no further imaging unless patient is symptomatic or CEA begins to increase further. Return to clinic in 3 months with repeat laboratory work and further evaluation.  2. Rectovaginal fistula: Patient has now completed her surgery with significant improvement of her symptoms. Treatment per surgery. 3. Pain: Continue current narcotic regimen.  Patient expressed understanding and was in agreement with this plan. She also understands that She can call clinic at any time with any questions, concerns, or complaints.   No matching staging information was found for the patient.  Lloyd Huger, MD   04/24/2015 3:37 PM

## 2015-07-20 ENCOUNTER — Inpatient Hospital Stay: Payer: Medicare Other

## 2015-07-20 ENCOUNTER — Inpatient Hospital Stay: Payer: Medicare Other | Admitting: Oncology

## 2015-08-03 ENCOUNTER — Inpatient Hospital Stay: Payer: Medicare Other | Attending: Oncology

## 2015-08-03 ENCOUNTER — Inpatient Hospital Stay (HOSPITAL_BASED_OUTPATIENT_CLINIC_OR_DEPARTMENT_OTHER): Payer: Medicare Other | Admitting: Oncology

## 2015-08-03 VITALS — BP 166/80 | HR 82 | Temp 97.3°F | Resp 16

## 2015-08-03 DIAGNOSIS — R531 Weakness: Secondary | ICD-10-CM

## 2015-08-03 DIAGNOSIS — C20 Malignant neoplasm of rectum: Secondary | ICD-10-CM

## 2015-08-03 DIAGNOSIS — R5381 Other malaise: Secondary | ICD-10-CM | POA: Diagnosis not present

## 2015-08-03 DIAGNOSIS — G893 Neoplasm related pain (acute) (chronic): Secondary | ICD-10-CM

## 2015-08-03 DIAGNOSIS — Z9889 Other specified postprocedural states: Secondary | ICD-10-CM

## 2015-08-03 DIAGNOSIS — R970 Elevated carcinoembryonic antigen [CEA]: Secondary | ICD-10-CM

## 2015-08-03 DIAGNOSIS — Z79899 Other long term (current) drug therapy: Secondary | ICD-10-CM | POA: Insufficient documentation

## 2015-08-03 DIAGNOSIS — I129 Hypertensive chronic kidney disease with stage 1 through stage 4 chronic kidney disease, or unspecified chronic kidney disease: Secondary | ICD-10-CM

## 2015-08-03 DIAGNOSIS — R197 Diarrhea, unspecified: Secondary | ICD-10-CM | POA: Insufficient documentation

## 2015-08-03 DIAGNOSIS — E785 Hyperlipidemia, unspecified: Secondary | ICD-10-CM | POA: Insufficient documentation

## 2015-08-03 DIAGNOSIS — Z933 Colostomy status: Secondary | ICD-10-CM

## 2015-08-03 DIAGNOSIS — Z8542 Personal history of malignant neoplasm of other parts of uterus: Secondary | ICD-10-CM | POA: Insufficient documentation

## 2015-08-03 DIAGNOSIS — K219 Gastro-esophageal reflux disease without esophagitis: Secondary | ICD-10-CM | POA: Insufficient documentation

## 2015-08-03 DIAGNOSIS — R5383 Other fatigue: Secondary | ICD-10-CM | POA: Insufficient documentation

## 2015-08-03 DIAGNOSIS — N823 Fistula of vagina to large intestine: Secondary | ICD-10-CM

## 2015-08-03 DIAGNOSIS — N189 Chronic kidney disease, unspecified: Secondary | ICD-10-CM | POA: Insufficient documentation

## 2015-08-03 LAB — CBC WITH DIFFERENTIAL/PLATELET
Basophils Absolute: 0.1 10*3/uL (ref 0–0.1)
Basophils Relative: 1 %
EOS ABS: 0.1 10*3/uL (ref 0–0.7)
EOS PCT: 1 %
HCT: 40 % (ref 35.0–47.0)
Hemoglobin: 13.3 g/dL (ref 12.0–16.0)
LYMPHS ABS: 2 10*3/uL (ref 1.0–3.6)
LYMPHS PCT: 16 %
MCH: 29.9 pg (ref 26.0–34.0)
MCHC: 33.3 g/dL (ref 32.0–36.0)
MCV: 90 fL (ref 80.0–100.0)
MONO ABS: 1 10*3/uL — AB (ref 0.2–0.9)
MONOS PCT: 8 %
Neutro Abs: 9.1 10*3/uL — ABNORMAL HIGH (ref 1.4–6.5)
Neutrophils Relative %: 74 %
PLATELETS: 302 10*3/uL (ref 150–440)
RBC: 4.45 MIL/uL (ref 3.80–5.20)
RDW: 15.6 % — ABNORMAL HIGH (ref 11.5–14.5)
WBC: 12.3 10*3/uL — ABNORMAL HIGH (ref 3.6–11.0)

## 2015-08-03 MED ORDER — HYDROCODONE-ACETAMINOPHEN 5-325 MG PO TABS: 1.0000 | ORAL_TABLET | Freq: Three times a day (TID) | ORAL | Status: DC

## 2015-08-03 NOTE — Progress Notes (Signed)
Patient having rectal pain she does have Hydrocodone and Fentanyl medication at home but they have expired.  She states that she does not like to take 2 tylenol because it makes her drowsy and would not like to use a narcotic.

## 2015-08-04 LAB — CEA: CEA: 26.2 ng/mL — AB (ref 0.0–4.7)

## 2015-08-21 NOTE — Progress Notes (Signed)
[place]  [phone] [phone]  ID: Anders Grant OB: [birth year]  MR#: [id number]  [id number]  Patient Care Team: [name], MD as PCP - General (Family Medicine)  CHIEF COMPLAINT:  Chief Complaint  Patient presents with  . Rectal Cancer    INTERVAL HISTORY: Patient return returns to clinic today for routine evaluation and laboratory work. She is having increased rectal pain, but states she is not taking any of her narcotics as prescribed.  She denies any recent fevers. She has no neurologic complaints.  She denies any chest pain or shortness of breath.  She denies any nausea, vomiting, or constipation.  She denies any melena or hematochezia. Patient offers no further specific complaints today.  REVIEW OF SYSTEMS:   Review of Systems  Constitutional: Positive for malaise/fatigue. Negative for fever.  Respiratory: Negative.   Cardiovascular: Negative.   Gastrointestinal: Positive for diarrhea.  Genitourinary: Negative for dysuria.  Neurological: Positive for weakness.    As per HPI. Otherwise, a complete review of systems is negatve.  PAST MEDICAL HISTORY: Past Medical History  Diagnosis Date  . Hyperlipidemia   . Hypertension   . Aortic ectasia   . Lumbar spondylosis   . Duodenitis   . Colovaginal fistula   . Malignant neoplasm of rectum   . Anemia   . Endometrial cancer   . GERD (gastroesophageal reflux disease)   . Chronic renal insufficiency     PAST SURGICAL HISTORY: Past Surgical History  Procedure Laterality Date  . Appendectomy    . Abdominal hysterectomy      FAMILY HISTORY Family History  Problem Relation Age of Onset  . Diabetes type II Mother   . Coronary artery disease Father   . Tuberculosis Father   . Diabetes type II Brother   . Liver cancer Son   . Colon cancer Son   . Coronary artery disease Brother   . Breast cancer Daughter        ADVANCED DIRECTIVES:    HEALTH  MAINTENANCE: Social History  Substance Use Topics  . Smoking status: Never Smoker   . Smokeless tobacco: Never Used  . Alcohol Use: No     Colonoscopy:  PAP:  Bone density:  Lipid panel:  Allergies  Allergen Reactions  . Sulfa Antibiotics Rash  . Meperidine Other (See Comments)    Very sick per pt    Current Outpatient Prescriptions  Medication Sig Dispense Refill  . acetaminophen (TYLENOL) 325 MG tablet Take by mouth.    . Cyanocobalamin (B-12) 5000 MCG SUBL Place under the tongue.    . diltiazem (DILACOR XR) 120 MG 24 hr capsule     . docusate sodium (STOOL SOFTENER) 100 MG capsule Take by mouth.    . ferrous sulfate 325 (65 FE) MG tablet Take by mouth.    . furosemide (LASIX) 20 MG tablet Take by mouth.    . hydrocortisone (ANUSOL-HC) 25 MG suppository Place rectally.    . metoprolol succinate (TOPROL-XL) 50 MG 24 hr tablet     . omeprazole (PRILOSEC) 20 MG capsule     . potassium chloride (K-DUR) 10 MEQ tablet Take by mouth.    . pravastatin (PRAVACHOL) 20 MG tablet TAKE 1 TABLET NIGHTLY    . promethazine (PHENERGAN) 25 MG suppository Place rectally.    . Vitamin D, Ergocalciferol, (DRISDOL) 50000 UNITS CAPS capsule     . zolpidem (AMBIEN) 5 MG tablet Take by mouth.    Marland Kitchen HYDROcodone-acetaminophen (NORCO/VICODIN) 5-325 MG tablet Take  1 tablet by mouth 3 (three) times daily. 30 tablet 0   No current facility-administered medications for this visit.    OBJECTIVE: Filed Vitals:   08/03/15 1619  BP: 166/80  Pulse: 82  Temp: 97.3 F (36.3 C)  Resp: 16     There is no height or weight on file to calculate BMI.    ECOG FS:1 - Symptomatic but completely ambulatory  General: Well-developed, well-nourished, no acute distress. Eyes: Pink conjunctiva, anicteric sclera. Lungs: Clear to auscultation bilaterally. Heart: Regular rate and rhythm. No rubs, murmurs, or gallops. Abdomen: Soft, nontender, nondistended. No organomegaly noted, normoactive bowel sounds. Colostomy in  left lower quadrant. Musculoskeletal: No edema, cyanosis, or clubbing. Neuro: Alert, answering all questions appropriately. Cranial nerves grossly intact. Skin: No rashes or petechiae noted. Psych: Normal affect.   LAB RESULTS:  Lab Results  Component Value Date   NA 139 09/10/2014   K 3.5 09/10/2014   CL 102 09/10/2014   CO2 27 09/10/2014   GLUCOSE 112* 09/10/2014   BUN 17 09/10/2014   CREATININE 1.17 09/10/2014   CALCIUM 8.8 09/10/2014   PROT 7.3 09/10/2014   ALBUMIN 3.5 09/10/2014   AST 13* 09/10/2014   ALT 14 09/10/2014   ALKPHOS 132* 09/10/2014   BILITOT 0.9 09/10/2014   GFRNONAA 46* 09/10/2014   GFRAA 56* 09/10/2014    Lab Results  Component Value Date   WBC 12.3* 08/03/2015   NEUTROABS 9.1* 08/03/2015   HGB 13.3 08/03/2015   HCT 40.0 08/03/2015   MCV 90.0 08/03/2015   PLT 302 08/03/2015     STUDIES: No results found.  ASSESSMENT: Stage IIa adenocarcinoma of the rectum.  PLAN:   1.  Rectal cancer: Previously, CT scan results reviewed independently and noted with possible residual disease. Given patient's age and performance status, it was determined that no further chemotherapy would be used. CEA is slowly increasing from 6.6 in January 2016 to 26.2 currently.  Since additional treatments are likely not optimal, no further imaging is necessary. Return to clinic in 4 months with repeat laboratory work and further evaluation.  2. Rectovaginal fistula: Patient has now completed her surgery with significant improvement of her symptoms. Treatment per surgery. 3. Pain: Patient was encouraged to use her narcotics as prescribed..  Patient expressed understanding and was in agreement with this plan. She also understands that She can call clinic at any time with any questions, concerns, or complaints.    Lloyd Huger, MD   08/21/2015 9:34 AM

## 2015-08-25 ENCOUNTER — Other Ambulatory Visit: Payer: Self-pay | Admitting: *Deleted

## 2015-09-08 ENCOUNTER — Encounter: Payer: Self-pay | Admitting: Emergency Medicine

## 2015-09-08 ENCOUNTER — Emergency Department: Payer: Medicare Other

## 2015-09-08 ENCOUNTER — Telehealth: Payer: Self-pay | Admitting: *Deleted

## 2015-09-08 ENCOUNTER — Inpatient Hospital Stay
Admission: EM | Admit: 2015-09-08 | Discharge: 2015-09-11 | DRG: 065 | Disposition: A | Discharge status: Skilled Nursing Facility | Payer: Medicare Other | Attending: Internal Medicine | Admitting: Internal Medicine

## 2015-09-08 DIAGNOSIS — Z933 Colostomy status: Secondary | ICD-10-CM | POA: Diagnosis not present

## 2015-09-08 DIAGNOSIS — I10 Essential (primary) hypertension: Secondary | ICD-10-CM | POA: Diagnosis present

## 2015-09-08 DIAGNOSIS — M47816 Spondylosis without myelopathy or radiculopathy, lumbar region: Secondary | ICD-10-CM | POA: Diagnosis present

## 2015-09-08 DIAGNOSIS — C19 Malignant neoplasm of rectosigmoid junction: Secondary | ICD-10-CM | POA: Diagnosis present

## 2015-09-08 DIAGNOSIS — I129 Hypertensive chronic kidney disease with stage 1 through stage 4 chronic kidney disease, or unspecified chronic kidney disease: Secondary | ICD-10-CM | POA: Diagnosis present

## 2015-09-08 DIAGNOSIS — I480 Paroxysmal atrial fibrillation: Secondary | ICD-10-CM | POA: Diagnosis present

## 2015-09-08 DIAGNOSIS — M19012 Primary osteoarthritis, left shoulder: Secondary | ICD-10-CM | POA: Diagnosis present

## 2015-09-08 DIAGNOSIS — Z993 Dependence on wheelchair: Secondary | ICD-10-CM | POA: Diagnosis not present

## 2015-09-08 DIAGNOSIS — N390 Urinary tract infection, site not specified: Secondary | ICD-10-CM | POA: Diagnosis present

## 2015-09-08 DIAGNOSIS — Z85048 Personal history of other malignant neoplasm of rectum, rectosigmoid junction, and anus: Secondary | ICD-10-CM | POA: Diagnosis not present

## 2015-09-08 DIAGNOSIS — E785 Hyperlipidemia, unspecified: Secondary | ICD-10-CM | POA: Diagnosis present

## 2015-09-08 DIAGNOSIS — K219 Gastro-esophageal reflux disease without esophagitis: Secondary | ICD-10-CM | POA: Diagnosis present

## 2015-09-08 DIAGNOSIS — I639 Cerebral infarction, unspecified: Secondary | ICD-10-CM | POA: Diagnosis not present

## 2015-09-08 DIAGNOSIS — Z79899 Other long term (current) drug therapy: Secondary | ICD-10-CM | POA: Diagnosis not present

## 2015-09-08 DIAGNOSIS — Z888 Allergy status to other drugs, medicaments and biological substances status: Secondary | ICD-10-CM

## 2015-09-08 DIAGNOSIS — N183 Chronic kidney disease, stage 3 unspecified: Secondary | ICD-10-CM | POA: Diagnosis present

## 2015-09-08 DIAGNOSIS — G8194 Hemiplegia, unspecified affecting left nondominant side: Secondary | ICD-10-CM | POA: Diagnosis present

## 2015-09-08 DIAGNOSIS — E871 Hypo-osmolality and hyponatremia: Secondary | ICD-10-CM | POA: Diagnosis present

## 2015-09-08 DIAGNOSIS — R29898 Other symptoms and signs involving the musculoskeletal system: Secondary | ICD-10-CM

## 2015-09-08 DIAGNOSIS — Z882 Allergy status to sulfonamides status: Secondary | ICD-10-CM

## 2015-09-08 DIAGNOSIS — Z8542 Personal history of malignant neoplasm of other parts of uterus: Secondary | ICD-10-CM

## 2015-09-08 DIAGNOSIS — I63132 Cerebral infarction due to embolism of left carotid artery: Secondary | ICD-10-CM | POA: Diagnosis not present

## 2015-09-08 HISTORY — DX: Unspecified atrial fibrillation: I48.91

## 2015-09-08 LAB — COMPREHENSIVE METABOLIC PANEL
ALBUMIN: 3.1 g/dL — AB (ref 3.5–5.0)
ALT: 13 U/L — AB (ref 14–54)
AST: 23 U/L (ref 15–41)
Alkaline Phosphatase: 97 U/L (ref 38–126)
Anion gap: 9 (ref 5–15)
BILIRUBIN TOTAL: 0.6 mg/dL (ref 0.3–1.2)
BUN: 24 mg/dL — AB (ref 6–20)
CHLORIDE: 98 mmol/L — AB (ref 101–111)
CO2: 24 mmol/L (ref 22–32)
CREATININE: 1.11 mg/dL — AB (ref 0.44–1.00)
Calcium: 8.9 mg/dL (ref 8.9–10.3)
GFR calc Af Amer: 48 mL/min — ABNORMAL LOW (ref >60)
GFR, EST NON AFRICAN AMERICAN: 41 mL/min — AB (ref >60)
GLUCOSE: 160 mg/dL — AB (ref 65–99)
POTASSIUM: 3.6 mmol/L (ref 3.5–5.1)
Sodium: 131 mmol/L — ABNORMAL LOW (ref 135–145)
TOTAL PROTEIN: 7.1 g/dL (ref 6.5–8.1)

## 2015-09-08 LAB — CBC WITH DIFFERENTIAL/PLATELET
BASOS ABS: 0.1 10*3/uL (ref 0–0.1)
BASOS PCT: 1 %
Eosinophils Absolute: 0.1 10*3/uL (ref 0–0.7)
Eosinophils Relative: 1 %
HEMATOCRIT: 38.9 % (ref 35.0–47.0)
Hemoglobin: 12.9 g/dL (ref 12.0–16.0)
LYMPHS PCT: 5 %
Lymphs Abs: 0.7 10*3/uL — ABNORMAL LOW (ref 1.0–3.6)
MCH: 29.3 pg (ref 26.0–34.0)
MCHC: 33.2 g/dL (ref 32.0–36.0)
MCV: 88.4 fL (ref 80.0–100.0)
MONO ABS: 0.7 10*3/uL (ref 0.2–0.9)
Monocytes Relative: 5 %
NEUTROS ABS: 11.9 10*3/uL — AB (ref 1.4–6.5)
NEUTROS PCT: 88 %
Platelets: 285 10*3/uL (ref 150–440)
RBC: 4.4 MIL/uL (ref 3.80–5.20)
RDW: 15.3 % — AB (ref 11.5–14.5)
WBC: 13.4 10*3/uL — AB (ref 3.6–11.0)

## 2015-09-08 LAB — URINALYSIS COMPLETE WITH MICROSCOPIC (ARMC ONLY)
BILIRUBIN URINE: NEGATIVE
Glucose, UA: NEGATIVE mg/dL
KETONES UR: NEGATIVE mg/dL
Nitrite: NEGATIVE
PH: 6 (ref 5.0–8.0)
PROTEIN: 100 mg/dL — AB
Specific Gravity, Urine: 1.019 (ref 1.005–1.030)
Squamous Epithelial / LPF: NONE SEEN

## 2015-09-08 LAB — PROTIME-INR
INR: 1.09
Prothrombin Time: 14.3 seconds (ref 11.4–15.0)

## 2015-09-08 LAB — TROPONIN I: TROPONIN I: 0.04 ng/mL — AB (ref <0.031)

## 2015-09-08 LAB — APTT: aPTT: 33 seconds (ref 24–36)

## 2015-09-08 MED ORDER — PRAVASTATIN SODIUM 20 MG PO TABS
20.0000 mg | ORAL_TABLET | Freq: Every day | ORAL | Status: DC
  Administered 2015-09-09 – 2015-09-10 (×3): 20 mg via ORAL
  Filled 2015-09-08 (×3): qty 1

## 2015-09-08 MED ORDER — DEXTROSE 5 % IV SOLN
1.0000 g | INTRAVENOUS | Status: DC
  Administered 2015-09-09 – 2015-09-10 (×2): 1 g via INTRAVENOUS
  Filled 2015-09-08 (×3): qty 10

## 2015-09-08 MED ORDER — STROKE: EARLY STAGES OF RECOVERY BOOK
Freq: Once | Status: AC
  Administered 2015-09-08: 23:00:00

## 2015-09-08 MED ORDER — ACETAMINOPHEN 325 MG PO TABS
650.0000 mg | ORAL_TABLET | Freq: Four times a day (QID) | ORAL | Status: DC | PRN
  Administered 2015-09-09 – 2015-09-10 (×5): 650 mg via ORAL
  Filled 2015-09-08: qty 1
  Filled 2015-09-08 (×5): qty 2

## 2015-09-08 MED ORDER — POLYVINYL ALCOHOL 1.4 % OP SOLN
1.0000 [drp] | OPHTHALMIC | Status: DC | PRN
  Administered 2015-09-09: 01:00:00 1 [drp] via OPHTHALMIC
  Filled 2015-09-08: qty 15

## 2015-09-08 MED ORDER — DEXTROSE 5 % IV SOLN
1.0000 g | Freq: Once | INTRAVENOUS | Status: AC
  Administered 2015-09-08: 1 g via INTRAVENOUS
  Filled 2015-09-08: qty 10

## 2015-09-08 MED ORDER — DILTIAZEM HCL ER COATED BEADS 120 MG PO CP24
120.0000 mg | ORAL_CAPSULE | Freq: Every day | ORAL | Status: DC
  Administered 2015-09-09 – 2015-09-11 (×3): 120 mg via ORAL
  Filled 2015-09-08 (×3): qty 1

## 2015-09-08 MED ORDER — ASPIRIN 81 MG PO CHEW
324.0000 mg | CHEWABLE_TABLET | Freq: Once | ORAL | Status: AC
  Administered 2015-09-08: 324 mg via ORAL
  Filled 2015-09-08: qty 4

## 2015-09-08 MED ORDER — POLYETHYLENE GLYCOL 3350 17 G PO PACK
17.0000 g | PACK | Freq: Every day | ORAL | Status: DC
  Administered 2015-09-09 – 2015-09-11 (×3): 17 g via ORAL
  Filled 2015-09-08 (×3): qty 1

## 2015-09-08 MED ORDER — PANTOPRAZOLE SODIUM 40 MG PO TBEC
40.0000 mg | DELAYED_RELEASE_TABLET | Freq: Every day | ORAL | Status: DC
  Administered 2015-09-09 – 2015-09-11 (×3): 40 mg via ORAL
  Filled 2015-09-08 (×3): qty 1

## 2015-09-08 MED ORDER — ENOXAPARIN SODIUM 30 MG/0.3ML ~~LOC~~ SOLN
30.0000 mg | Freq: Every day | SUBCUTANEOUS | Status: DC
  Administered 2015-09-09: 30 mg via SUBCUTANEOUS
  Filled 2015-09-08: qty 0.3

## 2015-09-08 MED ORDER — DOCUSATE SODIUM 100 MG PO CAPS
100.0000 mg | ORAL_CAPSULE | Freq: Two times a day (BID) | ORAL | Status: DC
  Administered 2015-09-09 – 2015-09-11 (×6): 100 mg via ORAL
  Filled 2015-09-08 (×6): qty 1

## 2015-09-08 NOTE — ED Notes (Signed)
Consult MD at bedside.

## 2015-09-08 NOTE — ED Provider Notes (Addendum)
[facility] Emergency Department Provider Note  ____________________________________________  Time seen: Approximately 5:20 PM  I have reviewed the triage vital signs and the nursing notes.   HISTORY  Chief Complaint Extremity Weakness    HPI [name] is a 82 y.o. female with atrial fibrillation, hypertension, hyperlipidemia, colorectal cancer status post colostomy who presents to of severe left leg weakness first noted on awakening at 4 AM this morning, currently severe, no modifying factors. The patient reports that she went to bed yesterday evening in her usual state of health. She woke this morning at 4 AM to change her depends and was unable to move her left leg. The leg felt heavy and numb. She reports that she thought that her symptoms would improve throughout the day however they did not. She denies any numbness or weakness in the arms, no headache, no vision disturbance, no speech difficulty. She denies any chest pain or difficulty breathing. She is complaining of pain in the left shoulder however had a fall approximately a year ago and has had pain there intermittently. She denies any chest pain or difficulty breathing.   Past Medical History  Diagnosis Date  . Hyperlipidemia   . Hypertension   . Aortic ectasia ([place])   . Lumbar spondylosis   . Duodenitis   . Colovaginal fistula   . Malignant neoplasm of rectum ([place])   . Anemia   . Endometrial cancer ([place])   . GERD (gastroesophageal reflux disease)   . Chronic renal insufficiency     There are no active problems to display for this patient.   Past Surgical History  Procedure Laterality Date  . Appendectomy    . Abdominal hysterectomy      Current Outpatient Rx  Name  Route  Sig  Dispense  Refill  . acetaminophen (TYLENOL) 500 MG tablet   Oral   Take 500-1,000 mg by mouth every 6 (six) hours as needed for mild pain or headache.         . cephALEXin (KEFLEX) 500 MG capsule    Oral   Take 500 mg by mouth 4 (four) times daily.         . Cyanocobalamin (VITAMIN B-12) 6000 MCG SUBL   Sublingual   Place 6,000 mcg under the tongue daily.         Marland Kitchen diltiazem (DILACOR XR) 120 MG 24 hr capsule   Oral   Take 120 mg by mouth daily.          Marland Kitchen docusate sodium (STOOL SOFTENER) 100 MG capsule   Oral   Take 100 mg by mouth 2 (two) times daily.          . ferrous sulfate 325 (65 FE) MG tablet   Oral   Take 325 mg by mouth daily.          . furosemide (LASIX) 20 MG tablet   Oral   Take 20 mg by mouth daily as needed for edema.          Marland Kitchen HYDROcodone-acetaminophen (NORCO/VICODIN) 5-325 MG tablet   Oral   Take 1 tablet by mouth 3 (three) times daily. Patient taking differently: Take 0.5-1 tablets by mouth 3 (three) times daily as needed for moderate pain.    30 tablet   0   . hydrocortisone (ANUSOL-HC) 25 MG suppository   Rectal   Place 25 mg rectally 2 (two) times daily as needed for hemorrhoids.          . hydrocortisone  2.5 % cream   Topical   Apply 1 application topically 2 (two) times daily as needed (for irritation).         Marland Kitchen omeprazole (PRILOSEC) 20 MG capsule   Oral   Take 20 mg by mouth daily.          . polyethylene glycol (MIRALAX / GLYCOLAX) packet   Oral   Take 17 g by mouth daily.         . potassium chloride (K-DUR) 10 MEQ tablet   Oral   Take 10 mEq by mouth daily as needed (when taking Lasix).          . pravastatin (PRAVACHOL) 20 MG tablet   Oral   Take 20 mg by mouth at bedtime.         . Vitamin D, Ergocalciferol, (DRISDOL) 50000 units CAPS capsule   Oral   Take 50,000 Units by mouth every 7 (seven) days. Pt takes on Wednesday.           Allergies Sulfa antibiotics and Meperidine  Family History  Problem Relation Age of Onset  . Diabetes type II Mother   . Coronary artery disease Father   . Tuberculosis Father   . Diabetes type II Brother   . Liver cancer Son   . Colon cancer Son   .  Coronary artery disease Brother   . Breast cancer Daughter     Social History Social History  Substance Use Topics  . Smoking status: Never Smoker   . Smokeless tobacco: Never Used  . Alcohol Use: No    Review of Systems Constitutional: No fever/chills Eyes: No visual changes. ENT: No sore throat. Cardiovascular: Denies chest pain. Respiratory: Denies shortness of breath. Gastrointestinal: No abdominal pain.  No nausea, no vomiting.  No diarrhea.  No constipation. Genitourinary: Negative for dysuria. Musculoskeletal: Negative for back pain. Skin: Negative for rash. Neurological: Negative for headaches, positive for left leg weakness and numbness.  10-point ROS otherwise negative.  ____________________________________________   PHYSICAL EXAM:   Filed Vitals:   09/08/15 1800 09/08/15 1830 09/08/15 1900 09/08/15 1916  BP: 119/81 129/76 141/95   Pulse:  107 59   Temp:    98.4 F (36.9 C)  Resp: 21 23 16    Height:      Weight:      SpO2:  98% 97%     Constitutional: Alert and oriented. Well appearing and in no acute distress. Eyes: Conjunctivae are normal. PERRL. EOMI. Head: Atraumatic. Nose: No congestion/rhinnorhea. Mouth/Throat: Mucous membranes are moist.  Oropharynx non-erythematous. Neck: No stridor.  Cardiovascular: Normal rate, irregular rhythm. Grossly normal heart sounds.  Good peripheral circulation. Respiratory: Normal respiratory effort.  No retractions. Lungs CTAB. Gastrointestinal: Soft and nontender. No distention.  No CVA tenderness. Genitourinary: deferred Musculoskeletal: Severe weakness in the left lower extremity, the patient is able to wiggle the toes of the left foot but is unable to left the bed against gravity. 2+ left DP pulse. 5 out of 5 strength in the right lower extremity and in bilateral upper extremity is. Decreased sensation to light touch in the left leg, intact sensation to light touch in all other extremities. Cranial nerves II  through XII intact. There is tenderness at the left shoulder but the patient allows full range of motion, though it is painful.  Neurologic:  Normal speech and language. No gross focal neurologic deficits are appreciated. No gait instability. Skin:  Skin is warm, dry and intact. No rash noted. Psychiatric: Mood and  affect are normal. Speech and behavior are normal.  ____________________________________________   LABS (all labs ordered are listed, but only abnormal results are displayed)  Labs Reviewed  CBC WITH DIFFERENTIAL/PLATELET - Abnormal; Notable for the following:    WBC 13.4 (*)    RDW 15.3 (*)    Neutro Abs 11.9 (*)    Lymphs Abs 0.7 (*)    All other components within normal limits  COMPREHENSIVE METABOLIC PANEL - Abnormal; Notable for the following:    Sodium 131 (*)    Chloride 98 (*)    Glucose, Bld 160 (*)    BUN 24 (*)    Creatinine, Ser 1.11 (*)    Albumin 3.1 (*)    ALT 13 (*)    GFR calc non Af Amer 41 (*)    GFR calc Af Amer 48 (*)    All other components within normal limits  TROPONIN I - Abnormal; Notable for the following:    Troponin I 0.04 (*)    All other components within normal limits  URINALYSIS COMPLETEWITH MICROSCOPIC ([facility] ONLY) - Abnormal; Notable for the following:    Color, Urine YELLOW (*)    APPearance TURBID (*)    Hgb urine dipstick 2+ (*)    Protein, ur 100 (*)    Leukocytes, UA 2+ (*)    Bacteria, UA MANY (*)    All other components within normal limits  PROTIME-INR  APTT   ____________________________________________  EKG  ED ECG REPORT I, Joanne Gavel, the attending physician, personally viewed and interpreted this ECG.   Date: 09/08/2015  EKG Time: 17:44  Rate: 125  Rhythm: unchanged from previous tracings, there are no previous tracings available for comparison, atrial fibrillation, rate 125  Axis: normal  Intervals:none  ST&T Change: No acute ST  elevation.  ____________________________________________  RADIOLOGY  CT head IMPRESSION: No acute findings by head CT. Expected cortical atrophy.  CXR IMPRESSION: 1. No radiographic evidence of acute cardiopulmonary disease. 2. Atherosclerosis.  Left shoulder xray IMPRESSION: No acute left shoulder findings. Osteopenia and glenohumeral degenerative changes noted.   ____________________________________________   PROCEDURES  Procedure(s) performed: None  Critical Care performed: Yes, see critical care note(s). Total critical care time spent 30 minutes.  ____________________________________________   INITIAL IMPRESSION / ASSESSMENT AND PLAN / ED COURSE  Pertinent labs & imaging results that were available during my care of the patient were reviewed by me and considered in my medical decision making (see chart for details).  [name] is a 82 y.o. female with atrial fibrillation, hypertension, hyperlipidemia, colorectal cancer status post colostomy who presents to of severe left leg weakness first noted on awakening at 4 AM this morning, currently severe, no modifying factors. On exam, she is nontoxic appearing and in no acute distress. Intermittently mildly tachycardic from time to time, heart rate currently 10 4 bpm. She has severe left lower extremity weakness, unable to lift the leg against gravity, she has decreased sensation. My concern is for acute CVA. She denies any pain in her back for pain in the leg. Pain in the shoulder is nonspecific and x-ray shows arthritic changes. CT head negative. Aspirin ordered. I discussed the case with Dr.Curnes of radiology with regards to whether not he recommended contrasted CT head to evaluate for metastasis given her history of rectal cancer. He has reviewed her scans as well as her recent PET scan, reports no need for additional CT scan at this time but does recommend MRI which she will get as  an inpatient. Discussed with the  hospitalist, Dr. Earleen Newport, for admission at 8 PM. Urinalysis concerning for urinary tract infection, ceftriaxone given. ____________________________________________   FINAL CLINICAL IMPRESSION(S) / ED DIAGNOSES  Final diagnoses:  Cerebral infarction due to unspecified mechanism  Left leg weakness      Joanne Gavel, MD 09/08/15 2010  Joanne Gavel, MD 09/08/15 2018

## 2015-09-08 NOTE — ED Notes (Signed)
Per EMS pt has hx of fall last year with related injury to L shoulder/arm. Pt has not had it evaluated and reports ongoing pain to L shoulder. LLE weaker than RLE. LUE with limited movement, difficult to tell if related to previous injury. No facial asymmetry, speech changes noted.

## 2015-09-08 NOTE — ED Notes (Signed)
Pt to ED via EMS c/o left leg weakness starting around 0400 this morning, last time normal last night before bed. Pt states leg "just wasn't working right."

## 2015-09-08 NOTE — Progress Notes (Signed)
Anticoagulation monitoring(Lovenox):  82 yo  ordered Lovenox 40 mg Q24h  Filed Weights   09/08/15 1727  Weight: 130 lb (58.968 kg)   BMI  Lab Results  Component Value Date   CREATININE 1.11* 09/08/2015   CREATININE 1.17 09/10/2014   CREATININE 1.02 05/14/2014   Estimated Creatinine Clearance: 25 mL/min (by C-G formula based on Cr of 1.11). Hemoglobin & Hematocrit     Component Value Date/Time   HGB 12.9 09/08/2015 1758   HGB 12.4 12/16/2014 1510   HCT 38.9 09/08/2015 1758   HCT 36.4 12/16/2014 1510     Per Protocol for Patient with estCrcl< 30 ml/min and BMI < 40, will transition to Lovenox 30 mg Q24h  [name] A. [place], [place].D., BCPS Clinical Pharmacist 09/08/2015 2306.

## 2015-09-08 NOTE — Telephone Encounter (Signed)
Agreed.  Her arm problem is likely unrelated to her rectal tumor.  If she is having difficulty walking as well, hospice may be a reasonable option.

## 2015-09-08 NOTE — H&P (Signed)
[facility] [place] NAME: [name]    MR#:  [id number]  DATE OF BIRTH:  09-29-1919  DATE OF ADMISSION:  09/08/2015  PRIMARY CARE PHYSICIAN: [name], MD   REQUESTING/REFERRING PHYSICIAN: Edd Fabian, MD  CHIEF COMPLAINT:   Chief Complaint  Patient presents with  . Extremity Weakness    L leg    HISTORY OF PRESENT ILLNESS:  [name]  is a 82 y.o. female who presents with left hemiparesis. Patient states that she laid down to sleep the night prior to admission, and when she woke up she was unable to get up out of bed in visit for somewhat confused as to why. Later she realized that she was unable to utilize her left leg. She called her daughter, who came over and also noticed that she had some left arm weakness. The patient eventually came to the ED for evaluation, where she had persistence of her symptoms. Workup was largely stable, except for mildly elevated white count and UA suspicious for UTI, the patient denies any associated symptoms. Hospitalists were called for evaluation and admission for stroke workup.  PAST MEDICAL HISTORY:   Past Medical History  Diagnosis Date  . Hyperlipidemia   . Hypertension   . 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 Lumbar spondylosis   . Duodenitis   . Colovaginal fistula   . Malignant neoplasm of rectum ([place])   . Anemia   . Endometrial cancer ([place])   . GERD (gastroesophageal reflux disease)   . Chronic renal insufficiency   . A-fib [facility])     PAST SURGICAL HISTORY:   Past Surgical History  Procedure Laterality Date  . Appendectomy    . Abdominal hysterectomy      SOCIAL HISTORY:   Social History  Substance Use Topics  . Smoking status: Never Smoker   . Smokeless tobacco: Never Used  . Alcohol Use: No    FAMILY HISTORY:   Family History  Problem Relation Age of Onset  . Diabetes type II Mother   . Coronary artery disease Father   . Tuberculosis Father   . Diabetes  type II Brother   . Liver cancer Son   . Colon cancer Son   . Coronary artery disease Brother   . Breast cancer Daughter     DRUG ALLERGIES:   Allergies  Allergen Reactions  . Sulfa Antibiotics Rash  . Meperidine Nausea And Vomiting    MEDICATIONS AT HOME:   Prior to Admission medications   Medication Sig Start Date End Date Taking? Authorizing Provider  acetaminophen (TYLENOL) 500 MG tablet Take 500-1,000 mg by mouth every 6 (six) hours as needed for mild pain or headache.   Yes Historical Provider, MD  cephALEXin (KEFLEX) 500 MG capsule Take 500 mg by mouth 4 (four) times daily. 09/07/15 09/14/15 Yes Historical Provider, MD  Cyanocobalamin (VITAMIN B-12) 6000 MCG SUBL Place 6,000 mcg under the tongue daily.   Yes Historical Provider, MD  diltiazem (DILACOR XR) 120 MG 24 hr capsule Take 120 mg by mouth daily.    Yes Historical Provider, MD  docusate sodium (STOOL SOFTENER) 100 MG capsule Take 100 mg by mouth 2 (two) times daily.    Yes Historical Provider, MD  ferrous sulfate 325 (65 FE) MG tablet Take 325 mg by mouth daily.    Yes Historical Provider, MD  furosemide (LASIX) 20 MG tablet Take 20 mg by mouth daily as needed for edema.    Yes Historical  Provider, MD  HYDROcodone-acetaminophen (NORCO/VICODIN) 5-325 MG tablet Take 1 tablet by mouth 3 (three) times daily. Patient taking differently: Take 0.5-1 tablets by mouth 3 (three) times daily as needed for moderate pain.  08/03/15  Yes Lloyd Huger, MD  hydrocortisone (ANUSOL-HC) 25 MG suppository Place 25 mg rectally 2 (two) times daily as needed for hemorrhoids.    Yes Historical Provider, MD  hydrocortisone 2.5 % cream Apply 1 application topically 2 (two) times daily as needed (for irritation).   Yes Historical Provider, MD  omeprazole (PRILOSEC) 20 MG capsule Take 20 mg by mouth daily.    Yes Historical Provider, MD  polyethylene glycol (MIRALAX / GLYCOLAX) packet Take 17 g by mouth daily.   Yes Historical Provider, MD   potassium chloride (K-DUR) 10 MEQ tablet Take 10 mEq by mouth daily as needed (when taking Lasix).    Yes Historical Provider, MD  pravastatin (PRAVACHOL) 20 MG tablet Take 20 mg by mouth at bedtime.   Yes Historical Provider, MD  Vitamin D, Ergocalciferol, (DRISDOL) 50000 units CAPS capsule Take 50,000 Units by mouth every 7 (seven) days. Pt takes on Wednesday.   Yes Historical Provider, MD    REVIEW OF SYSTEMS:  Review of Systems  Constitutional: Negative for fever, chills, weight loss and malaise/fatigue.  HENT: Negative for ear pain, hearing loss and tinnitus.   Eyes: Negative for blurred vision, double vision, pain and redness.  Respiratory: Negative for cough, hemoptysis and shortness of breath.   Cardiovascular: Negative for chest pain, palpitations, orthopnea and leg swelling.  Gastrointestinal: Negative for nausea, vomiting, abdominal pain, diarrhea and constipation.  Genitourinary: Negative for dysuria, frequency and hematuria.  Musculoskeletal: Negative for back pain, joint pain and neck pain.  Skin:       No acne, rash, or lesions  Neurological: Positive for focal weakness. Negative for dizziness, tremors and weakness.  Endo/Heme/Allergies: Negative for polydipsia. Does not bruise/bleed easily.  Psychiatric/Behavioral: Negative for depression. The patient is not nervous/anxious and does not have insomnia.      VITAL SIGNS:   Filed Vitals:   09/08/15 1800 09/08/15 1830 09/08/15 1900 09/08/15 1916  BP: 119/81 129/76 141/95   Pulse:  107 59   Temp:    98.4 F (36.9 C)  Resp: 21 23 16    Height:      Weight:      SpO2:  98% 97%    Wt Readings from Last 3 Encounters:  09/08/15 58.968 kg (130 lb)  04/19/15 59.699 kg (131 lb 9.8 oz)    PHYSICAL EXAMINATION:  Physical Exam  Vitals reviewed. Constitutional: She is oriented to person, place, and time. She appears well-developed and well-nourished. No distress.  HENT:  Head: Normocephalic and atraumatic.  Mouth/Throat:  Oropharynx is clear and moist.  Eyes: Conjunctivae and EOM are normal. Pupils are equal, round, and reactive to light. No scleral icterus.  Neck: Normal range of motion. Neck supple. No JVD present. No thyromegaly present.  Cardiovascular: Normal rate, regular rhythm and intact distal pulses.  Exam reveals no gallop and no friction rub.   No murmur heard. Respiratory: Effort normal and breath sounds normal. No respiratory distress. She has no wheezes. She has no rales.  GI: Soft. Bowel sounds are normal. She exhibits no distension. There is no tenderness.  Musculoskeletal: Normal range of motion. She exhibits no edema.  No arthritis, no gout  Lymphadenopathy:    She has no cervical adenopathy.  Neurological: She is alert and oriented to person, place, and time.  No cranial nerve deficit.  Neurologic: Cranial nerves II-XII intact, Sensation intact to light touch/pinprick, 5/5 strength in right extremities, 3+ to 4/5 strength left upper extremity with 3/5 strength left lower extremity, no dysarthria, no aphasia, no dysphagia, memory intact   Skin: Skin is warm and dry. No rash noted. No erythema.  Psychiatric: She has a normal mood and affect. Her behavior is normal. Judgment and thought content normal.    LABORATORY PANEL:   CBC  Recent Labs Lab 09/08/15 1758  WBC 13.4*  HGB 12.9  HCT 38.9  PLT 285   ------------------------------------------------------------------------------------------------------------------  Chemistries   Recent Labs Lab 09/08/15 1758  NA 131*  K 3.6  CL 98*  CO2 24  GLUCOSE 160*  BUN 24*  CREATININE 1.11*  CALCIUM 8.9  AST 23  ALT 13*  ALKPHOS 97  BILITOT 0.6   ------------------------------------------------------------------------------------------------------------------  Cardiac Enzymes  Recent Labs Lab 09/08/15 1758  TROPONINI 0.04*    ------------------------------------------------------------------------------------------------------------------  RADIOLOGY:  Ct Head Wo Contrast  09/08/2015  CLINICAL DATA:  Left-sided weakness. EXAM: CT HEAD WITHOUT CONTRAST TECHNIQUE: Contiguous axial images were obtained from the base of the skull through the vertex without intravenous contrast. COMPARISON:  None. FINDINGS: The brain shows cortical atrophy. This is consistent with the patient's age. The brain demonstrates no evidence of hemorrhage, infarction, edema, mass effect, extra-axial fluid collection, hydrocephalus or mass lesion. The skull is unremarkable. IMPRESSION: No acute findings by head CT.  Expected cortical atrophy. Electronically Signed   By: Aletta Edouard M.D.   On: 09/08/2015 18:41   Dg Chest Portable 1 View  09/08/2015  CLINICAL DATA:  82 year old female with left-sided weakness since this morning. Unable to walk. EXAM: PORTABLE CHEST 1 VIEW COMPARISON:  Chest x-ray 07/19/2009. FINDINGS: Lung volumes are normal. No consolidative airspace disease. No pleural effusions. No pneumothorax. No pulmonary nodule or mass noted. Pulmonary vasculature and the cardiomediastinal silhouette are within normal limits. Atherosclerosis in the thoracic aorta. IMPRESSION: 1.  No radiographic evidence of acute cardiopulmonary disease. 2. Atherosclerosis. Electronically Signed   By: Vinnie Langton M.D.   On: 09/08/2015 18:51   Dg Shoulder Left  09/08/2015  CLINICAL DATA:  Persistent left shoulder pain with left arm weakness since falling last year. Limited range of motion. EXAM: LEFT SHOULDER - 2+ VIEW COMPARISON:  None. FINDINGS: The bones are demineralized. There is no evidence of acute fracture or dislocation. There are glenohumeral degenerative changes. The subacromial space appears adequately maintained. Probable old left-sided rib fractures are noted. IMPRESSION: No acute left shoulder findings. Osteopenia and glenohumeral degenerative  changes noted. Electronically Signed   By: Richardean Sale M.D.   On: 09/08/2015 19:24    EKG:   Orders placed or performed during the hospital encounter of 09/08/15  . ED EKG  . ED EKG    IMPRESSION AND PLAN:  Principal Problem:   Stroke [facility]) - admit to pursue stroke workup per order sets and protocol. Neuro consult, MRI/MRA brain, carotid Dopplers, echocardiogram, lipids. Active Problems:   Paroxysmal a-fib (HCC) - contributed to elevated risk of stroke, patient was not on anticoagulation due to risk of bleeding with reported history of colon cancer.   UTI (lower urinary tract infection) - get urine culture, continue IV Rocephin, first dose given in the ED.   HTN (hypertension) - permissive hypertension first 24 hours BP goal Less than 220/110, then continue home meds   HLD (hyperlipidemia) - continue home meds   GERD (gastroesophageal reflux disease) - home dose PPI  CKD (chronic kidney disease), stage III - monitor, avoid nephrotoxins.  All the records are reviewed and case discussed with ED provider. Management plans discussed with the patient and/or family.  DVT PROPHYLAXIS: SubQ lovenox  GI PROPHYLAXIS: PPI  ADMISSION STATUS: Inpatient  CODE STATUS: Full Code Status History    This patient does not have a recorded code status. Please follow your organizational policy for patients in this situation.    Advance Directive Documentation        Most Recent Value   Type of Advance Directive  Healthcare Power of Attorney   Pre-existing out of facility DNR order (yellow form or pink MOST form)     "MOST" Form in Place?        TOTAL TIME TAKING CARE OF THIS PATIENT: 45 minutes.    Shristi Scheib [place] 09/08/2015, 9:23 PM  [facility]  Office  [phone]  CC: Primary care physician; [name], MD

## 2015-09-08 NOTE — Telephone Encounter (Signed)
Called to report that she is having difficulty not being able to ambulate and that she is having a problem with one of her arms and is asking if the tumor is pressing on a nerve causing this. I advised that I am not able to discuss specifically with her as she is not listed as a person on patient release but in generality I recommended that she take patient to ER for evaluation. She repeated to me that she should take her to ER and said she comes to all her appts and has a copy of all her records and is her Primary care giver as her father is 51 years old. She said she would have this corrected, I told her to be sure to be added at her next appt.

## 2015-09-08 NOTE — ED Notes (Signed)
Pt's daughter (POA) at bedside reports pt had blood in urine yesterday and was started on Keflex by her PCP. Pt has hx recurrent UTIs d/t colovaginal fistula.

## 2015-09-09 ENCOUNTER — Inpatient Hospital Stay: Payer: Medicare Other

## 2015-09-09 ENCOUNTER — Inpatient Hospital Stay
Admit: 2015-09-09 | Discharge: 2015-09-09 | Disposition: A | Discharge status: Home / Self Care | Payer: Medicare Other | Attending: Internal Medicine | Admitting: Internal Medicine

## 2015-09-09 LAB — HEMOGLOBIN A1C: HEMOGLOBIN A1C: 5.4 % (ref 4.0–6.0)

## 2015-09-09 LAB — TROPONIN I
Troponin I: <0.03 ng/mL (ref <0.031)
Troponin I: 0.05 ng/mL — ABNORMAL HIGH (ref <0.031)

## 2015-09-09 LAB — LIPID PANEL
CHOLESTEROL: 131 mg/dL (ref 0–200)
HDL: 30 mg/dL — ABNORMAL LOW (ref >40)
LDL Cholesterol: 68 mg/dL (ref 0–99)
TRIGLYCERIDES: 163 mg/dL — AB (ref <150)
Total CHOL/HDL Ratio: 4.4 RATIO
VLDL: 33 mg/dL (ref 0–40)

## 2015-09-09 LAB — MRSA PCR SCREENING: MRSA BY PCR: NEGATIVE

## 2015-09-09 MED ORDER — LIDOCAINE 5 % EX PTCH
1.0000 | MEDICATED_PATCH | CUTANEOUS | Status: DC
  Administered 2015-09-09 – 2015-09-11 (×3): 1 via TRANSDERMAL
  Filled 2015-09-09 (×3): qty 1

## 2015-09-09 MED ORDER — ENSURE ENLIVE PO LIQD
237.0000 mL | Freq: Two times a day (BID) | ORAL | Status: DC
  Administered 2015-09-09 – 2015-09-10 (×3): 237 mL via ORAL

## 2015-09-09 MED ORDER — ASPIRIN 81 MG PO CHEW
81.0000 mg | CHEWABLE_TABLET | Freq: Every day | ORAL | Status: DC
  Administered 2015-09-09 – 2015-09-11 (×3): 81 mg via ORAL
  Filled 2015-09-09 (×3): qty 1

## 2015-09-09 MED ORDER — APIXABAN 2.5 MG PO TABS: 2.5000 mg | ORAL_TABLET | Freq: Two times a day (BID) | ORAL | Status: DC

## 2015-09-09 MED ORDER — APIXABAN 2.5 MG PO TABS
2.5000 mg | ORAL_TABLET | Freq: Two times a day (BID) | ORAL | Status: DC
  Administered 2015-09-09 – 2015-09-11 (×4): 2.5 mg via ORAL
  Filled 2015-09-09 (×4): qty 1

## 2015-09-09 NOTE — Progress Notes (Signed)
[facility] NAME: [name]    MR#:  [id number]  DATE OF BIRTH:  1919/12/02  SUBJECTIVE:  CHIEF COMPLAINT:   Chief Complaint  Patient presents with  . Extremity Weakness    L leg   -Admitted with left leg weakness and noted to have acute infarct on the MRI. -Symptoms are improving. Significant weakness still present. Complaining of left shoulder pain which is chronic.  REVIEW OF SYSTEMS:  Review of Systems  Constitutional: Negative for fever and chills.  HENT: Negative for ear discharge, ear pain and nosebleeds.   Eyes: Negative for blurred vision and double vision.  Respiratory: Negative for cough, shortness of breath and wheezing.   Cardiovascular: Negative for chest pain and palpitations.  Gastrointestinal: Negative for nausea, vomiting, abdominal pain, diarrhea and constipation.  Genitourinary: Negative for dysuria.  Musculoskeletal: Positive for myalgias, joint pain and falls.  Neurological: Positive for focal weakness. Negative for dizziness, tremors, sensory change, seizures and headaches.       Left leg weakness noted  Endo/Heme/Allergies: Does not bruise/bleed easily.  Psychiatric/Behavioral: Negative for depression.    DRUG ALLERGIES:   Allergies  Allergen Reactions  . Sulfa Antibiotics Rash  . Meperidine Nausea And Vomiting    VITALS:  Blood pressure 120/67, pulse 105, temperature 98.1 F (36.7 C), temperature source Oral, resp. rate 16, height 5\' 2"  (1.575 m), weight 58.968 kg (130 lb), SpO2 95 %.  PHYSICAL EXAMINATION:  Physical Exam  GENERAL:  82 y.o.-year-old patient lying in the bed with no acute distress.  EYES: Pupils equal, round, reactive to light and accommodation. No scleral icterus. Extraocular muscles intact.  HEENT: Head atraumatic, normocephalic. Oropharynx and nasopharynx clear.  NECK:  Supple, no jugular venous distention. No thyroid enlargement, no tenderness.  LUNGS:  Normal breath sounds bilaterally, no wheezing, rales,rhonchi or crepitation. No use of accessory muscles of respiration. Decreased bibasilar breath sounds noted. CARDIOVASCULAR: S1, S2 normal. No rubs, or gallops.  recent rash 6 systolic murmur is present ABDOMEN: Soft, nontender, nondistended. Bowel sounds present. No organomegaly or mass.  EXTREMITIES: No pedal edema, cyanosis, or clubbing.  NEUROLOGIC: Cranial nerves II through XII are intact. Muscle strength right-sided extremities, left upper extremity is 5/5 and  left lower extremity is 4/ 5. Left upper extremity movement is limited due to left shoulder pain.on intact. Gait not checked.  PSYCHIATRIC: The patient is alert and oriented x 3.  SKIN: No obvious rash, lesion, or ulcer.    LABORATORY PANEL:   CBC  Recent Labs Lab 09/08/15 1758  WBC 13.4*  HGB 12.9  HCT 38.9  PLT 285   ------------------------------------------------------------------------------------------------------------------  Chemistries   Recent Labs Lab 09/08/15 1758  NA 131*  K 3.6  CL 98*  CO2 24  GLUCOSE 160*  BUN 24*  CREATININE 1.11*  CALCIUM 8.9  AST 23  ALT 13*  ALKPHOS 97  BILITOT 0.6   ------------------------------------------------------------------------------------------------------------------  Cardiac Enzymes  Recent Labs Lab 09/09/15 1102  TROPONINI 0.05*   ------------------------------------------------------------------------------------------------------------------  RADIOLOGY:  Ct Head Wo Contrast  09/08/2015  CLINICAL DATA:  Left-sided weakness. EXAM: CT HEAD WITHOUT CONTRAST TECHNIQUE: Contiguous axial images were obtained from the base of the skull through the vertex without intravenous contrast. COMPARISON:  None. FINDINGS: The brain shows cortical atrophy. This is consistent with the patient's age. The brain demonstrates no evidence of hemorrhage, infarction, edema, mass effect, extra-axial fluid collection,  hydrocephalus or mass lesion. The skull is unremarkable. IMPRESSION: No acute findings  by head CT.  Expected cortical atrophy. Electronically Signed   By: Aletta Edouard M.D.   On: 09/08/2015 18:41   Mr Brain Wo Contrast  09/09/2015  CLINICAL DATA:  Left hemi paresis. Stroke. She awoke from sleep with left-sided weakness. EXAM: MRI HEAD WITHOUT CONTRAST MRA HEAD WITHOUT CONTRAST TECHNIQUE: Multiplanar, multiecho pulse sequences of the brain and surrounding structures were obtained without intravenous contrast. Angiographic images of the head were obtained using MRA technique without contrast. COMPARISON:  CT head without contrast 09/08/2015 FINDINGS: MRI HEAD FINDINGS An acute nonhemorrhagic 12 mm white matter infarct is present in the right frontal lobe along the cortical spinal tracts. No other acute infarct is present. T2 changes are associated with the infarct. Moderate atrophy and white matter disease is present in addition. Dilated perivascular spaces are present within the basal ganglia bilaterally. White matter changes extend into the brainstem. The cerebellum is unremarkable. The internal auditory canals are within normal limits. Flow is present in the major intracranial arteries. Bilateral lens replacements are present. Globes orbits are otherwise intact. The paranasal sinuses are clear. There is fluid in left mastoid air cells. The skullbase is within normal limits. Midline sagittal images are unremarkable. MRA HEAD FINDINGS The internal carotid arteries demonstrate mild atherosclerotic change without significant stenosis. The left A1 segment is hypoplastic. Both A2 segments fill from the right. There is mild irregularity of the MCA branch vessels bilaterally. There is severe attenuation of flow in the proximal M2 branches bilaterally. This is artifactually exaggerated by patient motion. There is moderate attenuation of flow in the proximal right MCA branch vessels as well more prominent anterior than  posterior. The left vertebral artery is the dominant vessel. The left PICA origin is visualized and normal. Mile atherosclerotic changes are present in the distal left vertebral artery without significant stenosis or occlusion. The basilar artery is intact. Both posterior cerebral arteries are within normal limits. IMPRESSION: 1. Acute nonhemorrhagic 12 mm white matter infarct within the right cortical spinal tracts of the right frontal lobe. 2. Moderate atrophy and white matter disease without other acute infarcts. 3. Moderate attenuation of proximal MCA branch vessels bilaterally is likely exaggerated by patient motion artifact. Electronically Signed   By: San Morelle M.D.   On: 09/09/2015 13:37   US Carotid Bilateral  09/09/2015  CLINICAL DATA:  Stroke. EXAM: BILATERAL CAROTID DUPLEX ULTRASOUND TECHNIQUE: Pearline Cables scale imaging, color Doppler and duplex ultrasound were performed of bilateral carotid and vertebral arteries in the neck. COMPARISON:  No recent prior . FINDINGS: Criteria: Quantification of carotid stenosis is based on velocity parameters that correlate the residual internal carotid diameter with NASCET-based stenosis levels, using the diameter of the distal internal carotid lumen as the denominator for stenosis measurement. The following velocity measurements were obtained: RIGHT ICA:  81/26 cm/sec CCA:  [id number] cm/sec SYSTOLIC ICA/CCA RATIO:  1.6 DIASTOLIC ICA/CCA RATIO:  2.5 ECA:  108 cm/sec LEFT ICA:  81/27 cm/sec CCA:  [id number] cm/sec SYSTOLIC ICA/CCA RATIO:  1.2 DIASTOLIC ICA/CCA RATIO:  2.6 ECA:  90 cm/sec RIGHT CAROTID ARTERY: Mild carotid bifurcation and ICAs plaque. No flow limiting stenosis. RIGHT VERTEBRAL ARTERY:  Patent with antegrade flow. LEFT CAROTID ARTERY: Mild left distal common carotid carotid bifurcation and proximal ICA plaque. No flow limiting stenosis. LEFT VERTEBRAL ARTERY:  Patent antegrade flow. IMPRESSION: 1. Mild bilateral carotid atherosclerotic vascular plaque. No  flow limiting stenosis. Degree of stenosis less than 50%. 2. Vertebral arteries are patent antegrade flow. Electronically Signed   By: Marcello Moores  Register   On: 09/09/2015 10:16   Dg Chest Portable 1 View  09/08/2015  CLINICAL DATA:  82 year old female with left-sided weakness since this morning. Unable to walk. EXAM: PORTABLE CHEST 1 VIEW COMPARISON:  Chest x-ray 07/19/2009. FINDINGS: Lung volumes are normal. No consolidative airspace disease. No pleural effusions. No pneumothorax. No pulmonary nodule or mass noted. Pulmonary vasculature and the cardiomediastinal silhouette are within normal limits. Atherosclerosis in the thoracic aorta. IMPRESSION: 1.  No radiographic evidence of acute cardiopulmonary disease. 2. Atherosclerosis. Electronically Signed   By: Vinnie Langton M.D.   On: 09/08/2015 18:51   Dg Shoulder Left  09/08/2015  CLINICAL DATA:  Persistent left shoulder pain with left arm weakness since falling last year. Limited range of motion. EXAM: LEFT SHOULDER - 2+ VIEW COMPARISON:  None. FINDINGS: The bones are demineralized. There is no evidence of acute fracture or dislocation. There are glenohumeral degenerative changes. The subacromial space appears adequately maintained. Probable old left-sided rib fractures are noted. IMPRESSION: No acute left shoulder findings. Osteopenia and glenohumeral degenerative changes noted. Electronically Signed   By: Richardean Sale M.D.   On: 09/08/2015 19:24   Mr Jodene Nam Head/brain Wo Cm  09/09/2015  CLINICAL DATA:  Left hemi paresis. Stroke. She awoke from sleep with left-sided weakness. EXAM: MRI HEAD WITHOUT CONTRAST MRA HEAD WITHOUT CONTRAST TECHNIQUE: Multiplanar, multiecho pulse sequences of the brain and surrounding structures were obtained without intravenous contrast. Angiographic images of the head were obtained using MRA technique without contrast. COMPARISON:  CT head without contrast 09/08/2015 FINDINGS: MRI HEAD FINDINGS An acute nonhemorrhagic 12 mm  white matter infarct is present in the right frontal lobe along the cortical spinal tracts. No other acute infarct is present. T2 changes are associated with the infarct. Moderate atrophy and white matter disease is present in addition. Dilated perivascular spaces are present within the basal ganglia bilaterally. White matter changes extend into the brainstem. The cerebellum is unremarkable. The internal auditory canals are within normal limits. Flow is present in the major intracranial arteries. Bilateral lens replacements are present. Globes orbits are otherwise intact. The paranasal sinuses are clear. There is fluid in left mastoid air cells. The skullbase is within normal limits. Midline sagittal images are unremarkable. MRA HEAD FINDINGS The internal carotid arteries demonstrate mild atherosclerotic change without significant stenosis. The left A1 segment is hypoplastic. Both A2 segments fill from the right. There is mild irregularity of the MCA branch vessels bilaterally. There is severe attenuation of flow in the proximal M2 branches bilaterally. This is artifactually exaggerated by patient motion. There is moderate attenuation of flow in the proximal right MCA branch vessels as well more prominent anterior than posterior. The left vertebral artery is the dominant vessel. The left PICA origin is visualized and normal. Mile atherosclerotic changes are present in the distal left vertebral artery without significant stenosis or occlusion. The basilar artery is intact. Both posterior cerebral arteries are within normal limits. IMPRESSION: 1. Acute nonhemorrhagic 12 mm white matter infarct within the right cortical spinal tracts of the right frontal lobe. 2. Moderate atrophy and white matter disease without other acute infarcts. 3. Moderate attenuation of proximal MCA branch vessels bilaterally is likely exaggerated by patient motion artifact. Electronically Signed   By: San Morelle M.D.   On: 09/09/2015  13:37    EKG:   Orders placed or performed during the hospital encounter of 09/08/15  . ED EKG  . 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  #1 acute CVA-improving left leg weakness symptoms. -MRI of the brain indicating infarct in the right cortical spinal tract and also in the right frontal lobe. -Neurology consult is pending. Patient not taking any aspirin or anticoagulation. -Started on aspirin. Already on statin. -Due to history of A. fib, started on eliquis. Likely dose can be adjusted to 2.5 mg twice a day due to her age and also her weight. -With no significant stenosis. Echocardiogram is pending. -Physical therapy consulted-recommended rehabilitation. Patient very much interested in going home with home health. Repeat PT eval tomorrow.    #2 left shoulder pain-chronic, from arthritis. -Added Lidoderm patch  #3 urinary tract infection-cultures are pending. -Continue Rocephin at this time.    #4 paroxysmal atrial fibrillation-rate is controlled. Continue oral Cardizem. Patient was not started on anticoagulation as an outpatient due to risk of falls with her age and also history of rectal cancer as she might have bleeding risk. -However with acute CVA, it seems reasonable to start on anticoagulation at this time. -Outpatient follow-up with cardiology.  #5 DVT prophylaxis-will be started on eliquis. Discontinue Lovenox.   Physical therapy consulted    All the records are reviewed and case discussed with Care Management/Social Workerr. Management plans discussed with the patient, family and they are in agreement.  CODE STATUS:FULL CODE  TOTAL TIME TAKING CARE OF THIS PATIENT: 38 minutes.   POSSIBLE D/C IN 1-2 DAYS, DEPENDING ON  CLINICAL CONDITION.   Gladstone Lighter M.D on 09/09/2015 at 4:16 PM  Between 7am to 6pm - Pager - [phone]  After 6pm go to [email or web address] - password [email or web address] [facility]  Office  [phone]  CC: Primary care physician; [name], MD

## 2015-09-09 NOTE — Progress Notes (Signed)
ANTICOAGULATION CONSULT NOTE - Initial Consult  Pharmacy Consult for Eliquis (apixaban) Indication: atrial fibrillation  Allergies  Allergen Reactions  . Sulfa Antibiotics Rash  . Meperidine Nausea And Vomiting    Patient Measurements: Height: 5\' 2"  (157.5 cm) Weight: 130 lb (58.968 kg) IBW/kg (Calculated) : 50.1   Vital Signs: Temp: 98.1 F (36.7 C) (01/19 1408) Temp Source: Oral (01/19 1408) BP: 120/67 mmHg (01/19 1408) Pulse Rate: 105 (01/19 1408)  Labs:  Recent Labs  09/08/15 1758 09/09/15 0139 09/09/15 1102  HGB 12.9  --   --   HCT 38.9  --   --   PLT 285  --   --   APTT 33  --   --   LABPROT 14.3  --   --   INR 1.09  --   --   CREATININE 1.11*  --   --   TROPONINI 0.04* <0.03 0.05*    Estimated Creatinine Clearance: 25 mL/min (by C-G formula based on Cr of 1.11).   Medical History: Past Medical History  Diagnosis Date  . Hyperlipidemia   . Hypertension   . 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 Lumbar spondylosis   . Duodenitis   . Colovaginal fistula   . Malignant neoplasm of rectum ([place])   . Anemia   . Endometrial cancer ([place])   . GERD (gastroesophageal reflux disease)   . Chronic renal insufficiency   . A-fib ([facility])     Medications:  Scheduled:  . apixaban  2.5 mg Oral BID  . aspirin  81 mg Oral Daily  . cefTRIAXone (ROCEPHIN)  IV  1 g Intravenous Q24H  . diltiazem  120 mg Oral Daily  . docusate sodium  100 mg Oral BID  . feeding supplement (ENSURE ENLIVE)  237 mL Oral BID BM  . lidocaine  1 patch Transdermal Q24H  . pantoprazole  40 mg Oral QAC breakfast  . polyethylene glycol  17 g Oral Daily  . pravastatin  20 mg Oral QHS    Assessment: Patient is a 82 yo female with history of atrial fibrillation. MD desires to initiate anticoagulation therapy with apixaban.  SCr: 1.11, Wt: 59 kg  Goal of Therapy:  Monitor SCr and CBC per policy  Plan:  Based on patient weight (<60 kg) and age (>49), will start patient on apixaban 2.5 mg po BID.   Patient received a dose of lovenox 30 mg subq once at ~0100 today which has been discontinued by MD.    Pharmacy will continue to follow.   Alexsa Flaum G 09/09/2015,4:32 PM

## 2015-09-09 NOTE — Progress Notes (Signed)
Alert.  lidoderm for pain in left shoulder along with tylenol with relief. Mri pos for non hemorr. Infarct. Family in and out today/ speech saw started dys 3 diet. tol well

## 2015-09-09 NOTE — Progress Notes (Signed)
Initial Nutrition Assessment   INTERVENTION:   Meals and Snacks: Cater to patient preferences; SLP following recommending Mechanical Soft diet order for ease of chewing Medical Food Supplement Therapy: will recommend Ensure Enlive po BID, each supplement provides 350 kcal and 20 grams of protein   NUTRITION DIAGNOSIS:   Inadequate oral intake related to poor appetite as evidenced by per patient/family report.  GOAL:   Patient will meet greater than or equal to 90% of their needs  MONITOR:    (Energy intake, Digestive System, Anthropometrics, Electrolyte and Renal Profile, Glucose Profile)  REASON FOR ASSESSMENT:   Malnutrition Screening Tool    ASSESSMENT:   Pt admitted with stroke s/p ultrasound and MRI this am and UTI.  Past Medical History  Diagnosis Date  . Hyperlipidemia   . Hypertension   . 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 Lumbar spondylosis   . Duodenitis   . Colovaginal fistula   . Malignant neoplasm of rectum ([place])   . Anemia   . Endometrial cancer ([employer])   . GERD (gastroesophageal reflux disease)   . Chronic renal insufficiency   . A-fib [facility])     Diet Order:  DIET DYS 3 Room service appropriate?: Yes; Fluid consistency:: Thin    Current Nutrition: Pt ate bites of lunch today on visit, reports poor appetite. Pt reports eating a fried egg sandwich this am with SLP however, between her procedures.  Food/Nutrition-Related History: Pt reports poor appetite PTA difficult to assess how long appetite has been down. Pt reports drinking either chocolate Ensure or Boost daily.   Scheduled Medications:  . aspirin  81 mg Oral Daily  . cefTRIAXone (ROCEPHIN)  IV  1 g Intravenous Q24H  . diltiazem  120 mg Oral Daily  . docusate sodium  100 mg Oral BID  . enoxaparin (LOVENOX) injection  30 mg Subcutaneous QHS  . feeding supplement (ENSURE ENLIVE)  237 mL Oral BID BM  . lidocaine  1 patch Transdermal Q24H  . pantoprazole  40 mg Oral QAC breakfast  . polyethylene glycol   17 g Oral Daily  . pravastatin  20 mg Oral QHS     Electrolyte/Renal Profile and Glucose Profile:   Recent Labs Lab 09/08/15 1758  NA 131*  K 3.6  CL 98*  CO2 24  BUN 24*  CREATININE 1.11*  CALCIUM 8.9  GLUCOSE 160*   Protein Profile:  Recent Labs Lab 09/08/15 1758  ALBUMIN 3.1*    Gastrointestinal Profile: Last BM:  09/09/2015   Nutrition-Focused Physical Exam Findings:  Unable to complete Nutrition-Focused physical exam at this time.    Weight Change: Pt reports UBW between 128-130lbs, Pt weight of 129.9lbs on visit this afternoon.    Height:   Ht Readings from Last 1 Encounters:  09/08/15 5\' 2"  (1.575 m)    Weight:   Wt Readings from Last 1 Encounters:  09/08/15 130 lb (58.968 kg)    BMI:  Body mass index is 23.77 kg/(m^2).   EDUCATION NEEDS:   No education needs identified at this time   [place], RD, LDN Pager [phone] Weekend/On-Call Pager [phone]

## 2015-09-09 NOTE — Evaluation (Signed)
Occupational Therapy Evaluation Patient Details Name: [name] MRN: [id number] DOB: 10/18/1919 Today's Date: 09/09/2015    History of Present Illness [name] is a 82 y.o. female who presents with left hemiparesis. Patient states that she laid down to sleep the night prior to admission, and when she woke up she was unable to get up out of bed in visit for somewhat confused as to why. Later she realized that she was unable to utilize her left leg. She called her daughter, who came over and also noticed that she had some left arm weakness. The patient eventually came to the ED for evaluation, where she had persistence of her symptoms. Workup was largely stable, except for mildly elevated white count and UA suspicious for UTI, the patient denies any associated symptoms. Hospitalists were called for evaluation and admission for stroke workup.  An acute nonhemorrhagic 12 mm white matter infarct is present in the right frontal lobe along the cortical spinal tracts.   Clinical Impression   Pt is 82 year old female who presents to [facility] hospital with L sided weakness and MRI indicated an acute nonhemorrhagic infract in right frontal lobe along the cortical spinal tracts. Pt currently requires min assist for grooming and feeding skills, mod assist for UB bathing and dressing skills and mod assist for LB dressing skills.  She currently requires several rest breaks due to fatigue from several medical tests and therapy assessments.  No changed in vision.  Intact sensation in LUE and hand which is non-dominant UE but has impaired fine motor skills and opposition skills.  Grasp and release motor pattern intact but very weak and she has 8/10 shoulder pain on L from prior injury to shoulder with a pain patch in place. Pt resides with her husband at [facility] and needs assistance for bathing and colostomy care which her daughter provides each morning. Pt would benefit from skilled OT services to  increase independence in ADLs, neuromuscular re-ed, fine motor activities and rec SNF for continued rehab after DC from hospital.      Follow Up Recommendations  SNF    Equipment Recommendations       Recommendations for Other Services       Precautions / Restrictions Precautions Precautions: Fall Restrictions Weight Bearing Restrictions: No      Mobility Bed Mobility                  Transfers                      Balance                                            ADL Overall ADL's : Needs assistance/impaired                                       General ADL Comments: Pt requires moderate assist for groooming, min assist for feeding skills, mod assist for UB bathing and dressing skills and mod-max assist for LB dressing skills due to fatigue, decreased balance and decreased use of LUE and hand.  Pt required assistance for bathing and colostomy care from her daughter.       Vision     Perception     Praxis  Pertinent Vitals/Pain Pain Assessment: No/denies pain Pain Score: 8  Pain Location: L shoulder Pain Descriptors / Indicators: Aching;Sharp Pain Intervention(s): Limited activity within patient's tolerance;Premedicated before session;Monitored during session     Hand Dominance Right   Extremity/Trunk Assessment Upper Extremity Assessment Upper Extremity Assessment: LUE deficits/detail LUE Deficits / Details: Intact sensation; painful L shoulder from injury a year ago with guarding of extremity but no shoulder subluxation; able to flex elbow and shoulder 90 degrees but poor control with extension, active grasp and release but very weak with impaired opposition skills LUE: Unable to fully assess due to pain LUE Coordination: decreased fine motor;decreased gross motor           Communication Communication Communication: No difficulties   Cognition Arousal/Alertness: Awake/alert Behavior During  Therapy: WFL for tasks assessed/performed Overall Cognitive Status: Within Functional Limits for tasks assessed                     General Comments       Exercises       Shoulder Instructions      Home Living Family/patient expects to be discharged to:: Assisted living [facility] ) Living Arrangements: Spouse/significant other (Husband is at [facility] as well)                           Home Equipment: Gilford Rile - 4 wheels   Additional Comments: uses rollator all the time      Prior Functioning/Environment Level of Independence: Needs assistance  Gait / Transfers Assistance Needed: independent with rollator ADL's / Homemaking Assistance Needed: daughter assists with bathing and changing colostomy bag   Comments: Independent living; daughter stops by daily and assists with doctor appoints and medication    OT Diagnosis: Generalized weakness;Acute pain;Hemiplegia non-dominant side   OT Problem List: Decreased strength;Decreased activity tolerance;Impaired UE functional use;Impaired sensation;Pain;Decreased coordination   OT Treatment/Interventions:      OT Goals(Current goals can be found in the care plan section) Acute Rehab OT Goals Patient Stated Goal: To get stronger. OT Goal Formulation: With patient Time For Goal Achievement: 09/23/15 Potential to Achieve Goals: Good  OT Frequency: Min 1X/week   Barriers to D/C:            Co-evaluation              End of Session    Activity Tolerance: Patient limited by fatigue;Patient limited by pain Patient left: in bed;with call bell/phone within reach;with bed alarm set   Time: 1500-1530 OT Time Calculation (min): 30 min Charges:  OT General Charges $OT Visit: 1 Procedure OT Evaluation $OT Eval Moderate Complexity: 1 Procedure OT Treatments $Self Care/Home Management : 8-22 mins G-Codes:    Jocelynne Duquette 2015/09/25, 3:44 PM   Chrys Racer, OTR/L ascom [phone]

## 2015-09-09 NOTE — Evaluation (Signed)
Clinical/Bedside Swallow Evaluation Patient Details  Name: [name] MRN: [id number] Date of Birth: 05-15-1920  Today's Date: 09/09/2015 Time: SLP Start Time (ACUTE ONLY): 1000 SLP Stop Time (ACUTE ONLY): 1100 SLP Time Calculation (min) (ACUTE ONLY): 60 min  Past Medical History:  Past Medical History  Diagnosis Date  . Hyperlipidemia   . Hypertension   . Aortic ectasia ([place])   . Lumbar spondylosis   . Duodenitis   . Colovaginal fistula   . Malignant neoplasm of rectum ([place])   . Anemia   . Endometrial cancer ([place])   . GERD (gastroesophageal reflux disease)   . Chronic renal insufficiency   . A-fib [facility])    Past Surgical History:  Past Surgical History  Procedure Laterality Date  . Appendectomy    . Abdominal hysterectomy     HPI:  Pt is a 82 y.o. female w/ h/o GERD and other multiple medical issues (see chart) who presents with left hemiparesis. Patient states that she laid down to sleep the night prior to admission, and when she woke up she was unable to get up out of bed in visit for somewhat confused as to why. Later she realized that she was unable to utilize her left leg. She called her daughter, who came over and also noticed that she had some left arm weakness. The patient eventually came to the ED for evaluation, where she had persistence of her symptoms. Workup was largely stable, except for mildly elevated white count and UA suspicious for UTI, the patient denies any associated symptoms. Pt denies any trouble swallowing and no cognitive-linguistic deficits; she conversed easily and appropriately w/ SLP and NSG.    Assessment / Plan / Recommendation Clinical Impression  Pt appears at reduced risk for aspiration at this time as she consumed trials of thin liquids and solids w/ no overt s/s of aspiration noted; no coughing or change in vocal quality occured. Oral phase management of the softer foods during the eval/breakfast meal appeared appropriate; pt stated she  ate "softer" meats at her residence([place]). She required min tray setup and assist opening packages/cartons(she receives help w/ this at her residence). No cognitive-linguistic deficts noted. Pt appears at her baseline at this time; pt and Dtr agreed. Rec. a mech soft diet d/t the meats; general aspiration precautions; pt took oral meds w/ NSG using liquid while in room but able to use puree if nec. Pt agreed. No further skilled ST services indicated at this time. Pt agreed.    Aspiration Risk   (reduced)    Diet Recommendation  Mech soft; thin liquids; general aspiration precautions  Medication Administration: Whole meds with liquid    Other  Recommendations Recommended Consults:  (no) Oral Care Recommendations: Oral care BID;Patient independent with oral care   Follow up Recommendations   (PT/OT)    Frequency and Duration            Prognosis Prognosis for Safe Diet Advancement: Good      Swallow Study   General Date of Onset: 09/08/15 HPI: Pt is a 82 y.o. female w/ h/o GERD and other multiple medical issues (see chart) who presents with left hemiparesis. Patient states that she laid down to sleep the night prior to admission, and when she woke up she was unable to get up out of bed in visit for somewhat confused as to why. Later she realized that she was unable to utilize her left leg. She called her daughter, who came over and also noticed that  she had some left arm weakness. The patient eventually came to the ED for evaluation, where she had persistence of her symptoms. Workup was largely stable, except for mildly elevated white count and UA suspicious for UTI, the patient denies any associated symptoms. Pt denies any trouble swallowing and no cognitive-linguistic deficits; she conversed easily and appropriately w/ SLP and NSG.  Type of Study: Bedside Swallow Evaluation Previous Swallow Assessment: none Diet Prior to this Study: Regular;Thin liquids (softer, cut meats d/t ULE;  dentition) Temperature Spikes Noted: No Respiratory Status: Room air (wbc slightly elevated - concern for UTI per chart) History of Recent Intubation: No Behavior/Cognition: Alert;Cooperative;Pleasant mood Oral Cavity Assessment: Within Functional Limits Oral Care Completed by SLP: Recent completion by staff Oral Cavity - Dentition: Dentures, bottom;Missing dentition (few upper) Vision: Functional for self-feeding Self-Feeding Abilities: Needs assist;Needs set up (c/o chronic LUE pain in shoulder; now weaker) Patient Positioning: Upright in bed Baseline Vocal Quality: Normal Volitional Cough: Strong Volitional Swallow: Able to elicit    Oral/Motor/Sensory Function Overall Oral Motor/Sensory Function: Within functional limits   Ice Chips Ice chips: Within functional limits Presentation:  (x3 trials)   Thin Liquid Thin Liquid: Within functional limits Presentation: Cup;Self Fed;Straw (~4-5 ozs total) Other Comments: liquids meds w/ NSG also    Nectar Thick Nectar Thick Liquid: Not tested   Honey Thick Honey Thick Liquid: Not tested   Puree Puree: Within functional limits Presentation: Spoon;Self Fed (4-5 trials)   Solid   GO   Solid: Within functional limits Presentation: Self Fed (egg sandwich - 100%)        Alison Kenner, MS, CCC-SLP  [name] 09/09/2015,3:30 PM

## 2015-09-09 NOTE — Progress Notes (Signed)
Lab called trop level of 0.05 called to dr Tressia Miners no  New orders

## 2015-09-09 NOTE — Progress Notes (Signed)
Physical Therapy Evaluation Patient Details Name: [name] MRN: [id number] DOB: 1920-01-27 Today's Date: 09/09/2015   History of Present Illness  [name] is a 82 y.o. female who presents with left hemiparesis. Patient states that she laid down to sleep the night prior to admission, and when she woke up she was unable to get up out of bed in visit for somewhat confused as to why. Later she realized that she was unable to utilize her left leg. She called her daughter, who came over and also noticed that she had some left arm weakness. The patient eventually came to the ED for evaluation, where she had persistence of her symptoms. Workup was largely stable, except for mildly elevated white count and UA suspicious for UTI, the patient denies any associated symptoms. Hospitalists were called for evaluation and admission for stroke workup.  Clinical Impression  Pt presents with L sided weakness and functional mobility below baseline and would benefit from acute PT services to address objective findings.  Pt has acute L shoulder pain that she relates to a past injury but is acutely bothering her and is unable to use her L arm for bed mobility or transfers.  Pt also has demonstrated L LE weakness with 3-/5 strength in L leg.  Pt requires overall Min A for bed mobility and transfers and did not feel strong enough to walk this date despite assist from PT.    Follow Up Recommendations SNF    Equipment Recommendations  Rolling walker with 5" wheels    Recommendations for Other Services OT consult     Precautions / Restrictions Precautions Precautions: Fall Restrictions Weight Bearing Restrictions: No      Mobility  Bed Mobility Overal bed mobility: Needs Assistance Bed Mobility: Supine to Sit;Sit to Supine     Supine to sit: Min assist;HOB elevated Sit to supine: Mod assist;HOB elevated   General bed mobility comments: Min A to get to sitting, unable to use her L UE to  assist due to pain and needed physical assist to move L LE on bed.  Sit to supine with Mod A to elevate legs into bed and to scoot up in bed in supine.  Transfers Overall transfer level: Needs assistance Equipment used: Rolling walker (2 wheeled) Transfers: Sit to/from Stand Sit to Stand: Min assist         General transfer comment: Able to lift hips off bed and physical assist to get into erect standing, verbal cues to hold head up; posterior legs bracing against bed.  Ambulation/Gait             General Gait Details: Able to weight shift in standing but unable to take any steps due to fatigue/weakness and possibly fear.  Stairs            Wheelchair Mobility    Modified Rankin (Stroke Patients Only)       Balance Overall balance assessment: Needs assistance Sitting-balance support: Single extremity supported;Feet supported Sitting balance-Leahy Scale: Fair     Standing balance support: Bilateral upper extremity supported Standing balance-Leahy Scale: Poor Standing balance comment: posterior lean with legs against bed                             Pertinent Vitals/Pain Pain Assessment: 0-10 Pain Score: 8  Pain Location: L shoulder Pain Descriptors / Indicators: Aching Pain Intervention(s): Limited activity within patient's tolerance;Monitored during session;RN gave pain meds during session  Home Living Family/patient expects to be discharged to:: Assisted living Living Arrangements: Spouse/significant other             Home Equipment: Walker - 4 wheels Additional Comments: uses rollator all the time    Prior Function Level of Independence: Needs assistance   Gait / Transfers Assistance Needed: independent with rollator  ADL's / Homemaking Assistance Needed: daughter assists with bathing and changing colostomy bag  Comments: Independent living; daughter stops by daily and assists with doctor appoints and medication     Hand  Dominance        Extremity/Trunk Assessment   Upper Extremity Assessment: LUE deficits/detail       LUE Deficits / Details: Strength grossly 3/5 but difficult to assess due to pain; unable to use L UE for functional mobility in bed   Lower Extremity Assessment: LLE deficits/detail   LLE Deficits / Details: Strength grossly 2/10     Communication   Communication: No difficulties  Cognition Arousal/Alertness: Awake/alert Behavior During Therapy: WFL for tasks assessed/performed Overall Cognitive Status: Within Functional Limits for tasks assessed                      General Comments General comments (skin integrity, edema, etc.): colostomy    Exercises General Exercises - Lower Extremity Ankle Circles/Pumps: AROM;Both;10 reps;Supine Quad Sets: AROM;Both;10 reps;Supine Heel Slides: AROM;Both;10 reps      Assessment/Plan    PT Assessment Patient needs continued PT services  PT Diagnosis Difficulty walking;Generalized weakness;Acute pain;Hemiplegia non-dominant side   PT Problem List Decreased strength;Decreased range of motion;Decreased activity tolerance;Decreased balance;Decreased mobility;Decreased knowledge of use of DME;Decreased safety awareness;Pain  PT Treatment Interventions DME instruction;Gait training;Functional mobility training;Therapeutic activities;Therapeutic exercise;Balance training;Neuromuscular re-education;Patient/family education   PT Goals (Current goals can be found in the Care Plan section) Acute Rehab PT Goals Patient Stated Goal: To get stronger. PT Goal Formulation: With patient Time For Goal Achievement: 09/23/15 Potential to Achieve Goals: Fair    Frequency 7X/week   Barriers to discharge        Co-evaluation               End of Session Equipment Utilized During Treatment: Gait belt Activity Tolerance: Patient limited by fatigue;Patient limited by pain Patient left: in bed;with call bell/phone within reach;with  family/visitor present Nurse Communication: Mobility status         Time: [id number] PT Time Calculation (min) (ACUTE ONLY): 30 min   Charges:   PT Evaluation $PT Eval Moderate Complexity: 1 Procedure PT Treatments $Therapeutic Exercise: 8-22 mins   PT G Codes:        Pricila Bridge A Jazzman Loughmiller Oct 07, 2015, 1:12 PM

## 2015-09-09 NOTE — Plan of Care (Signed)
Problem: Education: Goal: Knowledge of disease or condition will improve Outcome: Progressing Pt likes to be called [name]. Pt has been living at [facility] with husband for five years    Past Medical History   Diagnosis  Date   .  Hyperlipidemia     .  Hypertension     .  Aortic ectasia ([place])     .  Lumbar spondylosis     .  Duodenitis     .  Colovaginal fistula     .  Malignant neoplasm of rectum ([place])     .  Anemia     .  Endometrial cancer ([place])     .  GERD (gastroesophageal reflux disease)     .  Chronic renal insufficiency     .  A-fib ([place])             Pt is well controlled with home medications.      Problem: Tissue Perfusion: Goal: Cerebral tissue perfusion will improve Outcome: Progressing Pt alert and oriented. Tylenol for left shoulder pain with relief. Neuro checks Q 2 hrs. No new neuro deficits noted during this shift. MRI and [place] in am. Continue to monitor

## 2015-09-10 ENCOUNTER — Encounter
Admission: RE | Admit: 2015-09-10 | Discharge: 2015-09-10 | Disposition: A | Discharge status: Home / Self Care | Payer: Medicare Other | Source: Ambulatory Visit | Attending: Internal Medicine | Admitting: Internal Medicine

## 2015-09-10 DIAGNOSIS — E871 Hypo-osmolality and hyponatremia: Secondary | ICD-10-CM | POA: Insufficient documentation

## 2015-09-10 LAB — BASIC METABOLIC PANEL
Anion gap: 11 (ref 5–15)
BUN: 22 mg/dL — AB (ref 6–20)
CHLORIDE: 99 mmol/L — AB (ref 101–111)
CO2: 22 mmol/L (ref 22–32)
Calcium: 8.6 mg/dL — ABNORMAL LOW (ref 8.9–10.3)
Creatinine, Ser: 1.04 mg/dL — ABNORMAL HIGH (ref 0.44–1.00)
GFR calc Af Amer: 52 mL/min — ABNORMAL LOW (ref >60)
GFR calc non Af Amer: 45 mL/min — ABNORMAL LOW (ref >60)
Glucose, Bld: 126 mg/dL — ABNORMAL HIGH (ref 65–99)
POTASSIUM: 4.1 mmol/L (ref 3.5–5.1)
SODIUM: 132 mmol/L — AB (ref 135–145)

## 2015-09-10 LAB — CBC
HEMATOCRIT: 36.7 % (ref 35.0–47.0)
HEMOGLOBIN: 12.3 g/dL (ref 12.0–16.0)
MCH: 29.7 pg (ref 26.0–34.0)
MCHC: 33.4 g/dL (ref 32.0–36.0)
MCV: 88.9 fL (ref 80.0–100.0)
Platelets: 285 10*3/uL (ref 150–440)
RBC: 4.13 MIL/uL (ref 3.80–5.20)
RDW: 15.6 % — ABNORMAL HIGH (ref 11.5–14.5)
WBC: 10.3 10*3/uL (ref 3.6–11.0)

## 2015-09-10 LAB — TROPONIN I
TROPONIN I: 0.07 ng/mL — AB (ref <0.031)
Troponin I: <0.03 ng/mL (ref <0.031)
Troponin I: <0.03 ng/mL (ref <0.031)

## 2015-09-10 MED ORDER — ACETAMINOPHEN 325 MG PO TABS
325.0000 mg | ORAL_TABLET | Freq: Four times a day (QID) | ORAL | Status: DC | PRN
  Administered 2015-09-10 – 2015-09-11 (×2): 325 mg via ORAL
  Filled 2015-09-10: qty 1

## 2015-09-10 MED ORDER — ENSURE ENLIVE PO LIQD: 237.0000 mL | Freq: Two times a day (BID) | ORAL | Status: AC

## 2015-09-10 MED ORDER — CEFUROXIME AXETIL 500 MG PO TABS: 500.0000 mg | ORAL_TABLET | Freq: Two times a day (BID) | ORAL | Status: DC

## 2015-09-10 MED ORDER — APIXABAN 2.5 MG PO TABS: 2.5000 mg | ORAL_TABLET | Freq: Two times a day (BID) | ORAL | Status: DC

## 2015-09-10 NOTE — Plan of Care (Signed)
Problem: Tissue Perfusion: Goal: Cerebral tissue perfusion will improve Outcome: Progressing No new neuro deficits noted during this shift. Alert and oriented. Continues on iv antibiotics.Tylenol for pain with relief. Continue to monitor

## 2015-09-10 NOTE — NC FL2 (Signed)
[place] LEVEL OF CARE SCREENING TOOL     IDENTIFICATION  Patient Name: [name] Birthdate: March 01, 1920 Sex: female Admission Date (Current Location): 09/08/2015  [place] and [place] Number:  Engineering geologist and Address:  [facility], [address] [place], [place] [address]      Provider Number: [id number]  Attending Physician Name and Address:  Bettey Costa, MD  Relative Name and Phone Number:       Current Level of Care: Hospital Recommended Level of Care: [place] Prior Approval Number:    Date Approved/Denied:   PASRR Number: [id number]  Discharge Plan: SNF    Current Diagnoses: Patient Active Problem List   Diagnosis Date Noted  . Stroke ([place]) 09/08/2015  . Paroxysmal a-fib ([place]) 09/08/2015  . HLD (hyperlipidemia) 09/08/2015  . HTN (hypertension) 09/08/2015  . GERD (gastroesophageal reflux disease) 09/08/2015  . CKD (chronic kidney disease), stage III 09/08/2015  . UTI (lower urinary tract infection) 09/08/2015    Orientation RESPIRATION BLADDER Height & Weight    Self, Time, Situation, Place  Normal Continent 5\' 2"  (157.5 cm) 130 lbs.  BEHAVIORAL SYMPTOMS/MOOD NEUROLOGICAL BOWEL NUTRITION STATUS      Continent Diet (DYS 3, Thin Liquids)  AMBULATORY STATUS COMMUNICATION OF NEEDS Skin   Extensive Assist Verbally Normal                       Personal Care Assistance Level of Assistance  Bathing, Feeding, Dressing Bathing Assistance: Maximum assistance Feeding assistance: Maximum assistance Dressing Assistance: Maximum assistance     Functional Limitations Info  Sight, Hearing, Speech Sight Info: Adequate Hearing Info: Adequate Speech Info: Adequate    SPECIAL CARE FACTORS FREQUENCY  PT (By licensed PT), OT (By licensed OT)     PT Frequency: 5 OT Frequency: 5            Contractures      Additional Factors Info  Allergies, Code Status Code Status Info: Full  Code Allergies Info: Allergies: Sulfa Antibiotics, Meperidine           Current Medications (09/10/2015):  This is the current hospital active medication list Current Facility-Administered Medications  Medication Dose Route Frequency Provider Last Rate Last Dose  . acetaminophen (TYLENOL) tablet 650 mg  650 mg Oral Q6H PRN Lance Coon, MD   650 mg at 09/10/15 [id number]  . apixaban (ELIQUIS) tablet 2.5 mg  2.5 mg Oral BID Crystal Jennefer Bravo, RPH   2.5 mg at 09/10/15 0817  . aspirin chewable tablet 81 mg  81 mg Oral Daily Gladstone Lighter, MD   81 mg at 09/10/15 0817  . cefTRIAXone (ROCEPHIN) 1 g in dextrose 5 % 50 mL IVPB  1 g Intravenous Q24H Lance Coon, MD   1 g at 09/09/15 1959  . diltiazem (CARDIZEM CD) 24 hr capsule 120 mg  120 mg Oral Daily Lance Coon, MD   120 mg at 09/10/15 1035  . docusate sodium (COLACE) capsule 100 mg  100 mg Oral BID Lance Coon, MD   100 mg at 09/10/15 0817  . feeding supplement (ENSURE ENLIVE) (ENSURE ENLIVE) liquid 237 mL  237 mL Oral BID BM Gladstone Lighter, MD   237 mL at 09/09/15 1538  . lidocaine (LIDODERM) 5 % 1 patch  1 patch Transdermal Q24H Gladstone Lighter, MD   1 patch at 09/09/15 1415  . pantoprazole (PROTONIX) EC tablet 40 mg  40 mg Oral QAC breakfast Lance Coon, MD  40 mg at 09/10/15 0817  . polyethylene glycol (MIRALAX / GLYCOLAX) packet 17 g  17 g Oral Daily Lance Coon, MD   17 g at 09/10/15 0817  . polyvinyl alcohol (LIQUIFILM TEARS) 1.4 % ophthalmic solution 1 drop  1 drop Right Eye PRN Lance Coon, MD   1 drop at 09/09/15 0111  . pravastatin (PRAVACHOL) tablet 20 mg  20 mg Oral QHS Lance Coon, MD   20 mg at 09/09/15 2214     Discharge Medications: Please see discharge summary for a list of discharge medications.  Relevant Imaging Results:  Relevant Lab Results:   Additional Information SSN:  [id number]  Darden Dates, LCSW

## 2015-09-10 NOTE — Progress Notes (Signed)
Physical Therapy Treatment Patient Details Name: [name] MRN: [id number] DOB: Jan 31, 1920 Today's Date: 09/10/2015    History of Present Illness [name] is a 82 y.o. female who presents with left hemiparesis. Patient states that she laid down to sleep the night prior to admission, and when she woke up she was unable to get up out of bed in visit for somewhat confused as to why. Later she realized that she was unable to utilize her left leg. She called her daughter, who came over and also noticed that she had some left arm weakness. The patient eventually came to the ED for evaluation, where she had persistence of her symptoms. Workup was largely stable, except for mildly elevated white count and UA suspicious for UTI, the patient denies any associated symptoms. Hospitalists were called for evaluation and admission for stroke workup.  An acute nonhemorrhagic 12 mm white matter infarct is present in the  frontal lobe area.    PT Comments    Pt demonstrates poor LE strength with continued L knee extension weakess (2+/5). Pt with poor standing balance. She is able to ambulate short distance but legs collapse and pt requires dependent transfer back to chair. Pt is very unsafe with ambulation and not appropriate to return to ILF at this time. Pt will need to go to SNF to facilitate safe return to prior level of function at home. Pt will benefit from skilled PT services to address deficits in strength, balance, and mobility in order to return to full function at home.    Follow Up Recommendations  SNF     Equipment Recommendations  Rolling walker with 5" wheels    Recommendations for Other Services OT consult     Precautions / Restrictions Precautions Precautions: Fall Restrictions Weight Bearing Restrictions: No    Mobility  Bed Mobility Overal bed mobility: Needs Assistance Bed Mobility: Supine to Sit     Supine to sit: Min assist Sit to supine: Mod assist    General bed mobility comments: Pt with inability to utilize LUE for bed mobility due to increased L shoulder pain with all activity. Pt with decreased L knee extension strength. She requires assist to come up to full sitting but able to maintain sitting balance independently and able to scoot toward EOB without assist.  Transfers Overall transfer level: Needs assistance Equipment used: Rolling walker (2 wheeled) Transfers: Sit to/from Stand Sit to Stand: Min assist;+2 safety/equipment         General transfer comment: Pt with decreased LE strength and power noted requiring mild increase in time to come to standing. Once upright pt is steady in standing with bilateral UE support  Ambulation/Gait Ambulation/Gait assistance: Max assist;+2 physical assistance Ambulation Distance (Feet): 5 Feet Assistive device: Rolling walker (2 wheeled) Gait Pattern/deviations: Step-to pattern     General Gait Details: Pt demonstrates very short steps with heavy UE reliance and poor balance. Pt slowly becomes increasingly weak with ambulation. She complains of L shoulder pain. Eventually her knees start to buckle and arms give out requiring maxA+2 from therapists with immediate return to sitting. Pt assisted with transfer to bed. Pt very unsafe with ambulation at this time   Stairs            Wheelchair Mobility    Modified Rankin (Stroke Patients Only)       Balance Overall balance assessment: Needs assistance Sitting-balance support: Bilateral upper extremity supported Sitting balance-Leahy Scale: Good     Standing balance support: Bilateral upper extremity  supported Standing balance-Leahy Scale: Poor                      Cognition Arousal/Alertness: Awake/alert Behavior During Therapy: WFL for tasks assessed/performed Overall Cognitive Status: Within Functional Limits for tasks assessed                      Exercises General Exercises - Lower Extremity Ankle  Circles/Pumps: Strengthening;Both;Supine;15 reps Quad Sets: Strengthening;Both;15 reps;Supine Gluteal Sets: Strengthening;Both;15 reps;Supine Short Arc Quad: Strengthening;Left;15 reps;Supine Long Arc Quad: Strengthening;Left;10 reps;Seated Heel Slides: Strengthening;Left;10 reps;Supine Hip ABduction/ADduction: Strengthening;Both;15 reps;Supine Straight Leg Raises: Strengthening;Both;15 reps;Supine    General Comments General comments (skin integrity, edema, etc.): Colostomy      Pertinent Vitals/Pain Pain Assessment: No/denies pain Pain Location: Pt denies pain at rest. Reports increase in L shoulder pain with movement Pain Intervention(s): Monitored during session;Patient requesting pain meds-RN notified;Limited activity within patient's tolerance    Home Living                      Prior Function            PT Goals (current goals can now be found in the care plan section) Acute Rehab PT Goals Patient Stated Goal: To get stronger. PT Goal Formulation: With patient Time For Goal Achievement: 09/23/15 Potential to Achieve Goals: Fair Progress towards PT goals: Progressing toward goals    Frequency  7X/week    PT Plan Current plan remains appropriate    Co-evaluation             End of Session Equipment Utilized During Treatment: Gait belt Activity Tolerance: Patient limited by fatigue;Patient limited by pain Patient left: in bed;with call bell/phone within reach;with family/visitor present;with bed alarm set     Time: [id number] PT Time Calculation (min) (ACUTE ONLY): 24 min  Charges:  $Gait Training: 8-22 mins $Therapeutic Exercise: 8-22 mins                    G Codes:      Lyndel Safe Huprich PT, DPT   Huprich,Jason 09/10/2015, 10:58 AM

## 2015-09-10 NOTE — Discharge Summary (Deleted)
[facility] NAME: [name]    MR#:  [id number]  DATE OF BIRTH:  05-28-1920  DATE OF ADMISSION:  09/08/2015 ADMITTING PHYSICIAN: Lance Coon, MD  DATE OF DISCHARGE: 02/08/2016 PRIMARY CARE PHYSICIAN: [name], MD    ADMISSION DIAGNOSIS:  UTI (lower urinary tract infection) [N39.0] Left leg weakness [R29.898] Cerebral infarction due to unspecified mechanism [I63.9]  DISCHARGE DIAGNOSIS:  Principal Problem:   Stroke [facility]) Active Problems:   Paroxysmal a-fib (HCC)   HLD (hyperlipidemia)   HTN (hypertension)   GERD (gastroesophageal reflux disease)   CKD (chronic kidney disease), stage III   UTI (lower urinary tract infection)   SECONDARY DIAGNOSIS:   Past Medical History  Diagnosis Date  . Hyperlipidemia   . Hypertension   . 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 Lumbar spondylosis   . Duodenitis   . Colovaginal fistula   . Malignant neoplasm of rectum ([place])   . Anemia   . Endometrial cancer ([place])   . GERD (gastroesophageal reflux disease)   . Chronic renal insufficiency   . A-fib [facility])     HOSPITAL COURSE:  82 year old female with past medical history significant for hypertension, malignant neoplasm of rectum  paroxysmal atrial fibrillation presents to the hospital secondary to left leg weakness and found to have acute stroke.  1. Acute infarct in the right cortical spinal tract and right frontal lobe: MRI showed above finding. Patient was started on aspirin. Due to the patient having a diagnosis of PAF, she was started on Eliquisocon was. Side effects, alternatives and risks were discussed with her. Due to her age and risk of bleeding she wil be discharged on only ELIQUIS and not dial therapy with ASA. She will also continue statin therapy. Physical therapy recommended skilled nursing facility. Carotid Doppler showed no significant stenosis. 2. Left shoulder pain: This is chronic from arthritis.  3.  Urinary tract infection: Patient was on Rocephin and will be discharged on Ceftin. Urine culture was taken and is pending  [id number] heart rate was controlled. Patient is on oral Cardizem. She was started on Eliquis, due to her acute stroke. Patient will follow-up with her cardiologist as outpatient.   5. Hyponatremia: This may be in part due to acute stroke. She will need repeat sodium level with her next PCP visit in one week. DISCHARGE CONDITIONS AND DIET:  Patient is stable for discharge on a heart healthy diet  CONSULTS OBTAINED:     DRUG ALLERGIES:   Allergies  Allergen Reactions  . Sulfa Antibiotics Rash  . Meperidine Nausea And Vomiting    DISCHARGE MEDICATIONS:   Current Discharge Medication List    START taking these medications   Details  apixaban (ELIQUIS) 2.5 MG TABS tablet Take 1 tablet (2.5 mg total) by mouth 2 (two) times daily. Qty: 60 tablet, Refills: 0    cefUROXime (CEFTIN) 500 MG tablet Take 1 tablet (500 mg total) by mouth 2 (two) times daily with a meal. Qty: 10 tablet, Refills: 0    feeding supplement, ENSURE ENLIVE, (ENSURE ENLIVE) LIQD Take 237 mLs by mouth 2 (two) times daily between meals. Qty: 237 mL, Refills: 12      CONTINUE these medications which have NOT CHANGED   Details  acetaminophen (TYLENOL) 500 MG tablet Take 500-1,000 mg by mouth every 6 (six) hours as needed for mild pain or headache.    Cyanocobalamin (VITAMIN B-12) 6000 MCG SUBL Place 6,000 mcg under the tongue daily.  diltiazem (DILACOR XR) 120 MG 24 hr capsule Take 120 mg by mouth daily.     docusate sodium (STOOL SOFTENER) 100 MG capsule Take 100 mg by mouth 2 (two) times daily.     ferrous sulfate 325 (65 FE) MG tablet Take 325 mg by mouth daily.     furosemide (LASIX) 20 MG tablet Take 20 mg by mouth daily as needed for edema.     HYDROcodone-acetaminophen (NORCO/VICODIN) 5-325 MG tablet Take 1 tablet by mouth 3 (three) times daily. Qty: 30 tablet, Refills: 0     hydrocortisone (ANUSOL-HC) 25 MG suppository Place 25 mg rectally 2 (two) times daily as needed for hemorrhoids.     hydrocortisone 2.5 % cream Apply 1 application topically 2 (two) times daily as needed (for irritation).    omeprazole (PRILOSEC) 20 MG capsule Take 20 mg by mouth daily.     polyethylene glycol (MIRALAX / GLYCOLAX) packet Take 17 g by mouth daily.    potassium chloride (K-DUR) 10 MEQ tablet Take 10 mEq by mouth daily as needed (when taking Lasix).     pravastatin (PRAVACHOL) 20 MG tablet Take 20 mg by mouth at bedtime.    Vitamin D, Ergocalciferol, (DRISDOL) 50000 units CAPS capsule Take 50,000 Units by mouth every 7 (seven) days. Pt takes on Wednesday.      STOP taking these medications     cephALEXin (KEFLEX) 500 MG capsule               Today   CHIEF COMPLAINT:  Patient is doing well this morning. Patient would like to go home rather than skilled nursing facility but understands that she may benefit from skilled nursing facility.   VITAL SIGNS:  Blood pressure 129/74, pulse 74, temperature 97.8 F (36.6 C), temperature source Oral, resp. rate 18, height 5\' 2"  (1.575 m), weight 58.968 kg (130 lb), SpO2 98 %.   REVIEW OF SYSTEMS:  Review of Systems  Constitutional: Negative for fever, chills and malaise/fatigue.  HENT: Negative for sore throat.   Eyes: Negative for blurred vision.  Respiratory: Negative for cough, hemoptysis, shortness of breath and wheezing.   Cardiovascular: Negative for chest pain, palpitations and leg swelling.  Gastrointestinal: Negative for nausea, vomiting, abdominal pain, diarrhea and blood in stool.  Genitourinary: Negative for dysuria.  Musculoskeletal: Positive for joint pain (shoulder). Negative for back pain.  Neurological: Positive for focal weakness. Negative for dizziness, tremors, sensory change, speech change and headaches.  Endo/Heme/Allergies: Does not bruise/bleed easily.     PHYSICAL EXAMINATION:   GENERAL:  82 y.o.-year-old patient lying in the bed with no acute distress.  NECK:  Supple, no jugular venous distention. No thyroid enlargement, no tenderness.  LUNGS: Normal breath sounds bilaterally, no wheezing, rales,rhonchi  No use of accessory muscles of respiration.  CARDIOVASCULAR: S1, S2 normal. No murmurs, rubs, or gallops.  ABDOMEN: Soft, non-tender, non-distended. Bowel sounds present. No organomegaly or mass.  EXTREMITIES: No pedal edema, cyanosis, or clubbing.  PSYCHIATRIC: The patient is alert and oriented x 3.  SKIN: No obvious rash, lesion, or ulcer.  Neuro: Cranial nerves II through XII are intact. Left upper to me 5 out of 5 left lower extreme May 4 out of 5. Left upper extremity is limited due to left shoulder pain. DATA REVIEW:   CBC  Recent Labs Lab 09/10/15 0706  WBC 10.3  HGB 12.3  HCT 36.7  PLT 285    Chemistries   Recent Labs Lab 09/08/15 1758 09/10/15 0706  NA 131* 132*  K 3.6 4.1  CL 98* 99*  CO2 24 22  GLUCOSE 160* 126*  BUN 24* 22*  CREATININE 1.11* 1.04*  CALCIUM 8.9 8.6*  AST 23  --   ALT 13*  --   ALKPHOS 97  --   BILITOT 0.6  --     Cardiac Enzymes  Recent Labs Lab 09/09/15 1714 09/09/15 2310 09/10/15 0706  TROPONINI <0.03 <0.03 0.07*    Microbiology Results  @[email or web address] @  RADIOLOGY:  Ct Head Wo Contrast  09/08/2015  CLINICAL DATA:  Left-sided weakness. EXAM: CT HEAD WITHOUT CONTRAST TECHNIQUE: Contiguous axial images were obtained from the base of the skull through the vertex without intravenous contrast. COMPARISON:  None. FINDINGS: The brain shows cortical atrophy. This is consistent with the patient's age. The brain demonstrates no evidence of hemorrhage, infarction, edema, mass effect, extra-axial fluid collection, hydrocephalus or mass lesion. The skull is unremarkable. IMPRESSION: No acute findings by head CT.  Expected cortical atrophy. Electronically Signed   By: Aletta Edouard M.D.   On: 09/08/2015 18:41   Mr  Brain Wo Contrast  09/09/2015  CLINICAL DATA:  Left hemi paresis. Stroke. She awoke from sleep with left-sided weakness. EXAM: MRI HEAD WITHOUT CONTRAST MRA HEAD WITHOUT CONTRAST TECHNIQUE: Multiplanar, multiecho pulse sequences of the brain and surrounding structures were obtained without intravenous contrast. Angiographic images of the head were obtained using MRA technique without contrast. COMPARISON:  CT head without contrast 09/08/2015 FINDINGS: MRI HEAD FINDINGS An acute nonhemorrhagic 12 mm white matter infarct is present in the right frontal lobe along the cortical spinal tracts. No other acute infarct is present. T2 changes are associated with the infarct. Moderate atrophy and white matter disease is present in addition. Dilated perivascular spaces are present within the basal ganglia bilaterally. White matter changes extend into the brainstem. The cerebellum is unremarkable. The internal auditory canals are within normal limits. Flow is present in the major intracranial arteries. Bilateral lens replacements are present. Globes orbits are otherwise intact. The paranasal sinuses are clear. There is fluid in left mastoid air cells. The skullbase is within normal limits. Midline sagittal images are unremarkable. MRA HEAD FINDINGS The internal carotid arteries demonstrate mild atherosclerotic change without significant stenosis. The left A1 segment is hypoplastic. Both A2 segments fill from the right. There is mild irregularity of the MCA branch vessels bilaterally. There is severe attenuation of flow in the proximal M2 branches bilaterally. This is artifactually exaggerated by patient motion. There is moderate attenuation of flow in the proximal right MCA branch vessels as well more prominent anterior than posterior. The left vertebral artery is the dominant vessel. The left PICA origin is visualized and normal. Mile atherosclerotic changes are present in the distal left vertebral artery without significant  stenosis or occlusion. The basilar artery is intact. Both posterior cerebral arteries are within normal limits. IMPRESSION: 1. Acute nonhemorrhagic 12 mm white matter infarct within the right cortical spinal tracts of the right frontal lobe. 2. Moderate atrophy and white matter disease without other acute infarcts. 3. Moderate attenuation of proximal MCA branch vessels bilaterally is likely exaggerated by patient motion artifact. Electronically Signed   By: San Morelle M.D.   On: 09/09/2015 13:37   US Carotid Bilateral  09/09/2015  CLINICAL DATA:  Stroke. EXAM: BILATERAL CAROTID DUPLEX ULTRASOUND TECHNIQUE: Pearline Cables scale imaging, color Doppler and duplex ultrasound were performed of bilateral carotid and vertebral arteries in the neck. COMPARISON:  No recent prior . FINDINGS: Criteria: Quantification of carotid stenosis is based on  velocity parameters that correlate the residual internal carotid diameter with NASCET-based stenosis levels, using the diameter of the distal internal carotid lumen as the denominator for stenosis measurement. The following velocity measurements were obtained: RIGHT ICA:  81/26 cm/sec CCA:  [id number] cm/sec SYSTOLIC ICA/CCA RATIO:  1.6 DIASTOLIC ICA/CCA RATIO:  2.5 ECA:  108 cm/sec LEFT ICA:  81/27 cm/sec CCA:  [id number] cm/sec SYSTOLIC ICA/CCA RATIO:  1.2 DIASTOLIC ICA/CCA RATIO:  2.6 ECA:  90 cm/sec RIGHT CAROTID ARTERY: Mild carotid bifurcation and ICAs plaque. No flow limiting stenosis. RIGHT VERTEBRAL ARTERY:  Patent with antegrade flow. LEFT CAROTID ARTERY: Mild left distal common carotid carotid bifurcation and proximal ICA plaque. No flow limiting stenosis. LEFT VERTEBRAL ARTERY:  Patent antegrade flow. IMPRESSION: 1. Mild bilateral carotid atherosclerotic vascular plaque. No flow limiting stenosis. Degree of stenosis less than 50%. 2. Vertebral arteries are patent antegrade flow. Electronically Signed   By: Marcello Moores  Register   On: 09/09/2015 10:16   Dg Chest Portable 1  View  09/08/2015  CLINICAL DATA:  82 year old female with left-sided weakness since this morning. Unable to walk. EXAM: PORTABLE CHEST 1 VIEW COMPARISON:  Chest x-ray 07/19/2009. FINDINGS: Lung volumes are normal. No consolidative airspace disease. No pleural effusions. No pneumothorax. No pulmonary nodule or mass noted. Pulmonary vasculature and the cardiomediastinal silhouette are within normal limits. Atherosclerosis in the thoracic aorta. IMPRESSION: 1.  No radiographic evidence of acute cardiopulmonary disease. 2. Atherosclerosis. Electronically Signed   By: Vinnie Langton M.D.   On: 09/08/2015 18:51   Dg Shoulder Left  09/08/2015  CLINICAL DATA:  Persistent left shoulder pain with left arm weakness since falling last year. Limited range of motion. EXAM: LEFT SHOULDER - 2+ VIEW COMPARISON:  None. FINDINGS: The bones are demineralized. There is no evidence of acute fracture or dislocation. There are glenohumeral degenerative changes. The subacromial space appears adequately maintained. Probable old left-sided rib fractures are noted. IMPRESSION: No acute left shoulder findings. Osteopenia and glenohumeral degenerative changes noted. Electronically Signed   By: Richardean Sale M.D.   On: 09/08/2015 19:24   Mr Jodene Nam Head/brain Wo Cm  09/09/2015  CLINICAL DATA:  Left hemi paresis. Stroke. She awoke from sleep with left-sided weakness. EXAM: MRI HEAD WITHOUT CONTRAST MRA HEAD WITHOUT CONTRAST TECHNIQUE: Multiplanar, multiecho pulse sequences of the brain and surrounding structures were obtained without intravenous contrast. Angiographic images of the head were obtained using MRA technique without contrast. COMPARISON:  CT head without contrast 09/08/2015 FINDINGS: MRI HEAD FINDINGS An acute nonhemorrhagic 12 mm white matter infarct is present in the right frontal lobe along the cortical spinal tracts. No other acute infarct is present. T2 changes are associated with the infarct. Moderate atrophy and white  matter disease is present in addition. Dilated perivascular spaces are present within the basal ganglia bilaterally. White matter changes extend into the brainstem. The cerebellum is unremarkable. The internal auditory canals are within normal limits. Flow is present in the major intracranial arteries. Bilateral lens replacements are present. Globes orbits are otherwise intact. The paranasal sinuses are clear. There is fluid in left mastoid air cells. The skullbase is within normal limits. Midline sagittal images are unremarkable. MRA HEAD FINDINGS The internal carotid arteries demonstrate mild atherosclerotic change without significant stenosis. The left A1 segment is hypoplastic. Both A2 segments fill from the right. There is mild irregularity of the MCA branch vessels bilaterally. There is severe attenuation of flow in the proximal M2 branches bilaterally. This is artifactually exaggerated by patient motion. There is moderate  attenuation of flow in the proximal right MCA branch vessels as well more prominent anterior than posterior. The left vertebral artery is the dominant vessel. The left PICA origin is visualized and normal. Mile atherosclerotic changes are present in the distal left vertebral artery without significant stenosis or occlusion. The basilar artery is intact. Both posterior cerebral arteries are within normal limits. IMPRESSION: 1. Acute nonhemorrhagic 12 mm white matter infarct within the right cortical spinal tracts of the right frontal lobe. 2. Moderate atrophy and white matter disease without other acute infarcts. 3. Moderate attenuation of proximal MCA branch vessels bilaterally is likely exaggerated by patient motion artifact. Electronically Signed   By: San Morelle M.D.   On: 09/09/2015 13:37      Management plans discussed with the patient and she is in agreement. Stable for discharge   Patient should follow up with PCP in 1 week  BMP at that time adn CBCb  CODE STATUS:      Code Status Orders        Start     Ordered   09/08/15 2303  Full code   Continuous     09/08/15 2302    Code Status History    Date Active Date Inactive Code Status Order ID Comments User Context   This patient has a current code status but no historical code status.    Advance Directive Documentation        Most Recent Value   Type of Advance Directive  Healthcare Power of Attorney   Pre-existing out of facility DNR order (yellow form or pink MOST form)     "MOST" Form in Place?        TOTAL TIME TAKING CARE OF THIS PATIENT: 35 minutes.    Note: This dictation was prepared with Dragon dictation along with smaller phrase technology. Any transcriptional errors that result from this process are unintentional.  Kalyiah Saintil M.D on 09/10/2015 at 11:03 AM  Between 7am to 6pm - Pager - [phone] After 6pm go to [email or web address] - password [email or web address] [place]  Office  [phone]  CC: Primary care physician; [name], MD

## 2015-09-10 NOTE — Clinical Social Work Placement (Signed)
   CLINICAL SOCIAL WORK PLACEMENT  NOTE  Date:  09/10/2015  Patient Details  Name: [name] MRN: [id number] Date of Birth: May 12, 1920  Clinical Social Work is seeking post-discharge placement for this patient at the [place] level of care (*CSW will initial, date and re-position this form in  chart as items are completed):  Yes   Patient/family provided with [place] Work Department's list of facilities offering this level of care within the geographic area requested by the patient (or if unable, by the patient's family).  Yes   Patient/family informed of their freedom to choose among providers that offer the needed level of care, that participate in Medicare, Medicaid or managed care program needed by the patient, have an available bed and are willing to accept the patient.  Yes   Patient/family informed of [facility]'s ownership interest in [facility] and [facility], as well as of the fact that they are under no obligation to receive care at these facilities.  PASRR submitted to EDS on       PASRR number received on       Existing PASRR number confirmed on 09/10/15     FL2 transmitted to all facilities in geographic area requested by pt/family on 09/10/15     FL2 transmitted to all facilities within larger geographic area on       Patient informed that his/her managed care company has contracts with or will negotiate with certain facilities, including the following:        Yes   Patient/family informed of bed offers received.  Patient chooses bed at [facility]     Physician recommends and patient chooses bed at  [facility])    Patient to be transferred to   on  .  Patient to be transferred to facility by       Patient family notified on   of transfer.  Name of family member notified:        PHYSICIAN       Additional Comment:    _______________________________________________ Darden Dates, LCSW 09/10/2015, 3:13  PM

## 2015-09-10 NOTE — Care Management Important Message (Signed)
Important Message  Patient Details  Name: [name] MRN: [id number] Date of Birth: Sep 25, 1919   Medicare Important Message Given:  Yes    Juliann Pulse A Tranisha Tissue 09/10/2015, 10:09 AM

## 2015-09-10 NOTE — Clinical Social Work Note (Signed)
Clinical Social Work Assessment  Patient Details  Name: [name] MRN: [id number] Date of Birth: 1919/09/12  Date of referral:  09/10/15               Reason for consult:  Facility Placement                Permission sought to share information with:  Family Supports Permission granted to share information::  Yes, Verbal Permission Granted  Name::     Daughter, [name]   Housing/Transportation Living arrangements for the past 2 months:  [place] of Information:  Patient, Adult Children Patient Interpreter Needed:  None Criminal Activity/Legal Involvement Pertinent to Current Situation/Hospitalization:  No - Comment as needed Significant Relationships:  Adult Children Lives with:  Self Do you feel safe going back to the place where you live?  No (Pt would like a higher level of care. ) Need for family participation in patient care:  Yes (Comment)  Care giving concerns:  Pt lives alone and may need a higher level of care for LTC.    Social Worker assessment / plan:  CSW met with pt to address consult. CSW introduced herself and explained role of social work. CSW also explained process of discharging to SNF. PT is recommending SNF at discharge. Pt shared that she lives on her own and may need more support. Pt wanted CSW to speak with duaghter, [name], who is also her POA. CSW spoke with daughter and she is agreeable to discharge to SNF. CSW initiated SNF search and followed up with bed offers. Family chose [facility] Pt will discharge tomorrow (09/11/2015). CSW updated MD. CSW will continue to follow.   Employment status:  Retired Nurse, adult PT Recommendations:  [place] / Referral to community resources:  [place]  Patient/Family's Response to care:  Pt and daughter were appreciative of CSW support.   Patient/Family's Understanding of and Emotional Response to Diagnosis,  Current Treatment, and Prognosis:  Pt and daughter understand that pt needs rehab prior to returning home.   Emotional Assessment Appearance:  Appears stated age Attitude/Demeanor/Rapport:  Other (Appropriate) Affect (typically observed):  Accepting, Adaptable, Pleasant Orientation:  Oriented to Self, Oriented to Place, Oriented to  Time, Oriented to Situation Alcohol / Substance use:  Never Used Psych involvement (Current and /or in the community):  No (Comment)  Discharge Needs  Concerns to be addressed:  Adjustment to Illness Readmission within the last 30 days:  No Current discharge risk:  Lives alone Barriers to Discharge:  Barriers Resolved   Darden Dates, LCSW 09/10/2015, 3:18 PM

## 2015-09-11 DIAGNOSIS — I63132 Cerebral infarction due to embolism of left carotid artery: Secondary | ICD-10-CM

## 2015-09-11 LAB — URINE CULTURE

## 2015-09-11 LAB — TROPONIN I: Troponin I: <0.03 ng/mL (ref <0.031)

## 2015-09-11 NOTE — Progress Notes (Signed)
[place] [facility] [place] NAME: [name]    MR#:  [id number]  DATE OF BIRTH:  Feb 04, 1943  SUBJECTIVE:  Doing well this am  REVIEW OF SYSTEMS:    Review of Systems  Constitutional: Negative for fever, chills and malaise/fatigue.  HENT: Negative for sore throat.   Eyes: Negative for blurred vision.  Respiratory: Negative for cough, hemoptysis, shortness of breath and wheezing.   Cardiovascular: Negative for chest pain, palpitations and leg swelling.  Gastrointestinal: Negative for nausea, vomiting, abdominal pain, diarrhea and blood in stool.  Genitourinary: Negative for dysuria.  Musculoskeletal: Positive for joint pain (shoulder left). Negative for back pain.  Neurological: Negative for dizziness, tremors and headaches.  Endo/Heme/Allergies: Does not bruise/bleed easily.    Tolerating Diet:ye      DRUG ALLERGIES:   Allergies  Allergen Reactions  . Sulfa Antibiotics Rash  . Meperidine Nausea And Vomiting    VITALS:  Blood pressure 131/64, pulse 128, temperature 98.2 F (36.8 C), temperature source Oral, resp. rate 18, height 5\' 2"  (1.575 m), weight 58.968 kg (130 lb), SpO2 96 %.  PHYSICAL EXAMINATION:   Physical Exam  Constitutional: She is oriented to person, place, and time and well-developed, well-nourished, and in no distress. No distress.  HENT:  Head: Normocephalic.  Eyes: No scleral icterus.  Neck: Normal range of motion. Neck supple. No JVD present. No tracheal deviation present.  Cardiovascular: Normal rate, regular rhythm and normal heart sounds.  Exam reveals no gallop and no friction rub.   No murmur heard. Irr, irr  Pulmonary/Chest: Effort normal and breath sounds normal. No respiratory distress. She has no wheezes. She has no rales. She exhibits no tenderness.  Abdominal: Soft. Bowel sounds are normal. She exhibits no distension and no mass. There is no tenderness. There is no rebound and no  guarding.  Musculoskeletal: Normal range of motion. She exhibits no edema.  Neurological: She is alert and oriented to person, place, and time.  LLE 4/5  Skin: Skin is warm. No rash noted. No erythema.  Psychiatric: Affect and judgment normal.      LABORATORY PANEL:   CBC  Recent Labs Lab 09/10/15 0706  WBC 10.3  HGB 12.3  HCT 36.7  PLT 285   ------------------------------------------------------------------------------------------------------------------  Chemistries   Recent Labs Lab 09/08/15 1758 09/10/15 0706  NA 131* 132*  K 3.6 4.1  CL 98* 99*  CO2 24 22  GLUCOSE 160* 126*  BUN 24* 22*  CREATININE 1.11* 1.04*  CALCIUM 8.9 8.6*  AST 23  --   ALT 13*  --   ALKPHOS 97  --   BILITOT 0.6  --    ------------------------------------------------------------------------------------------------------------------  Cardiac Enzymes  Recent Labs Lab 09/10/15 1645 09/10/15 2247 09/11/15 0517  TROPONINI <0.03 <0.03 <0.03   ------------------------------------------------------------------------------------------------------------------  RADIOLOGY:  Mr Brain Wo Contrast  09/09/2015  CLINICAL DATA:  Left hemi paresis. Stroke. She awoke from sleep with left-sided weakness. EXAM: MRI HEAD WITHOUT CONTRAST MRA HEAD WITHOUT CONTRAST TECHNIQUE: Multiplanar, multiecho pulse sequences of the brain and surrounding structures were obtained without intravenous contrast. Angiographic images of the head were obtained using MRA technique without contrast. COMPARISON:  CT head without contrast 09/08/2015 FINDINGS: MRI HEAD FINDINGS An acute nonhemorrhagic 12 mm white matter infarct is present in the right frontal lobe along the cortical spinal tracts. No other acute infarct is present. T2 changes are associated with the infarct. Moderate atrophy and white matter disease is present in addition. Dilated perivascular  spaces are present within the basal ganglia bilaterally. White matter  changes extend into the brainstem. The cerebellum is unremarkable. The internal auditory canals are within normal limits. Flow is present in the major intracranial arteries. Bilateral lens replacements are present. Globes orbits are otherwise intact. The paranasal sinuses are clear. There is fluid in left mastoid air cells. The skullbase is within normal limits. Midline sagittal images are unremarkable. MRA HEAD FINDINGS The internal carotid arteries demonstrate mild atherosclerotic change without significant stenosis. The left A1 segment is hypoplastic. Both A2 segments fill from the right. There is mild irregularity of the MCA branch vessels bilaterally. There is severe attenuation of flow in the proximal M2 branches bilaterally. This is artifactually exaggerated by patient motion. There is moderate attenuation of flow in the proximal right MCA branch vessels as well more prominent anterior than posterior. The left vertebral artery is the dominant vessel. The left PICA origin is visualized and normal. Mile atherosclerotic changes are present in the distal left vertebral artery without significant stenosis or occlusion. The basilar artery is intact. Both posterior cerebral arteries are within normal limits. IMPRESSION: 1. Acute nonhemorrhagic 12 mm white matter infarct within the right cortical spinal tracts of the right frontal lobe. 2. Moderate atrophy and white matter disease without other acute infarcts. 3. Moderate attenuation of proximal MCA branch vessels bilaterally is likely exaggerated by patient motion artifact. Electronically Signed   By: San Morelle M.D.   On: 09/09/2015 13:37   US Carotid Bilateral  09/09/2015  CLINICAL DATA:  Stroke. EXAM: BILATERAL CAROTID DUPLEX ULTRASOUND TECHNIQUE: Pearline Cables scale imaging, color Doppler and duplex ultrasound were performed of bilateral carotid and vertebral arteries in the neck. COMPARISON:  No recent prior . FINDINGS: Criteria: Quantification of carotid  stenosis is based on velocity parameters that correlate the residual internal carotid diameter with NASCET-based stenosis levels, using the diameter of the distal internal carotid lumen as the denominator for stenosis measurement. The following velocity measurements were obtained: RIGHT ICA:  81/26 cm/sec CCA:  [id number] cm/sec SYSTOLIC ICA/CCA RATIO:  1.6 DIASTOLIC ICA/CCA RATIO:  2.5 ECA:  108 cm/sec LEFT ICA:  81/27 cm/sec CCA:  [id number] cm/sec SYSTOLIC ICA/CCA RATIO:  1.2 DIASTOLIC ICA/CCA RATIO:  2.6 ECA:  90 cm/sec RIGHT CAROTID ARTERY: Mild carotid bifurcation and ICAs plaque. No flow limiting stenosis. RIGHT VERTEBRAL ARTERY:  Patent with antegrade flow. LEFT CAROTID ARTERY: Mild left distal common carotid carotid bifurcation and proximal ICA plaque. No flow limiting stenosis. LEFT VERTEBRAL ARTERY:  Patent antegrade flow. IMPRESSION: 1. Mild bilateral carotid atherosclerotic vascular plaque. No flow limiting stenosis. Degree of stenosis less than 50%. 2. Vertebral arteries are patent antegrade flow. Electronically Signed   By: Marcello Moores  Register   On: 09/09/2015 10:16   Mr Jodene Nam Head/brain Wo Cm  09/09/2015  CLINICAL DATA:  Left hemi paresis. Stroke. She awoke from sleep with left-sided weakness. EXAM: MRI HEAD WITHOUT CONTRAST MRA HEAD WITHOUT CONTRAST TECHNIQUE: Multiplanar, multiecho pulse sequences of the brain and surrounding structures were obtained without intravenous contrast. Angiographic images of the head were obtained using MRA technique without contrast. COMPARISON:  CT head without contrast 09/08/2015 FINDINGS: MRI HEAD FINDINGS An acute nonhemorrhagic 12 mm white matter infarct is present in the right frontal lobe along the cortical spinal tracts. No other acute infarct is present. T2 changes are associated with the infarct. Moderate atrophy and white matter disease is present in addition. Dilated perivascular spaces are present within the basal ganglia bilaterally. White matter changes extend into  the brainstem. The cerebellum is unremarkable. The internal auditory canals are within normal limits. Flow is present in the major intracranial arteries. Bilateral lens replacements are present. Globes orbits are otherwise intact. The paranasal sinuses are clear. There is fluid in left mastoid air cells. The skullbase is within normal limits. Midline sagittal images are unremarkable. MRA HEAD FINDINGS The internal carotid arteries demonstrate mild atherosclerotic change without significant stenosis. The left A1 segment is hypoplastic. Both A2 segments fill from the right. There is mild irregularity of the MCA branch vessels bilaterally. There is severe attenuation of flow in the proximal M2 branches bilaterally. This is artifactually exaggerated by patient motion. There is moderate attenuation of flow in the proximal right MCA branch vessels as well more prominent anterior than posterior. The left vertebral artery is the dominant vessel. The left PICA origin is visualized and normal. Mile atherosclerotic changes are present in the distal left vertebral artery without significant stenosis or occlusion. The basilar artery is intact. Both posterior cerebral arteries are within normal limits. IMPRESSION: 1. Acute nonhemorrhagic 12 mm white matter infarct within the right cortical spinal tracts of the right frontal lobe. 2. Moderate atrophy and white matter disease without other acute infarcts. 3. Moderate attenuation of proximal MCA branch vessels bilaterally is likely exaggerated by patient motion artifact. Electronically Signed   By: San Morelle M.D.   On: 09/09/2015 13:37     ASSESSMENT AND PLAN:  82 year old female with past medical history significant for hypertension, malignant neoplasm of rectum  paroxysmal atrial fibrillation presents to the hospital secondary to left leg weakness and found to have acute stroke.  1. Acute infarct in the right cortical spinal tract and right frontal lobe: MRI  showed above finding. Patient was started on aspirin. Due to the patient having a diagnosis of PAF, she was started on Eliquisocon was. Side effects, alternatives and risks were discussed with her. Due to her age and risk of bleeding she wil be discharged on only ELIQUIS and not dial therapy with ASA. She will also continue statin therapy. Physical therapy recommended skilled nursing facility. Carotid Doppler showed no significant stenosis.  2. Left shoulder pain: This is chronic from arthritis.  3. Urinary tract infection: Patient was on Rocephin and will be discharged on Ceftin. Urine culture was taken and is pending  [id number] heart rate was controlled. Patient is on oral Cardizem. She was started on Eliquis, due to her acute stroke. Patient will follow-up with her cardiologist as outpatient.   5. Hyponatremia: This may be in part due to acute stroke. She will need repeat sodium level with her next PCP visit in one week.      Management plans discussed with the patient and she is in agreement.  CODE STATUS: FULL  TOTAL TIME TAKING CARE OF THIS PATIENT: 30 minutes.     POSSIBLE D/C today/tomorrow, DEPENDING ON CLINICAL CONDITION.   Tyreek Clabo M.D on 09/11/2015 at 7:40 AM  Between 7am to 6pm - Pager - [phone] After 6pm go to [email or web address] - password [email or web address]  Office  [phone]  CC: Primary care physician; [name], MD  Note: This dictation was prepared with Dragon dictation along with smaller phrase technology. Any transcriptional errors that result from this process are unintentional.

## 2015-09-11 NOTE — Consult Note (Signed)
Referring Physician: Mody    Chief Complaint: Left sided weakness  HPI: [name] is an 82 y.o. female who reports going to bed normal on the 17th.  She ambulates with a walker and is incontinent but is able to dress herself, feed herself and handle her own self care.  Reports that when she woke up on the 18th she was unable to get up out of bed and was somewhat confused as to why. Later she realized that she was unable to utilize her left leg. She called her daughter, who came over and also noticed that she had some left arm weakness. The patient eventually came to the ED for evaluation.   Date last known well: 09/07/2015 Time last known well: Time: 21:00 tPA Given: No: Outside time window   Modified Rankin: Rankin Score=3  Past Medical History  Diagnosis Date  . Hyperlipidemia   . Hypertension   . 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 Lumbar spondylosis   . Duodenitis   . Colovaginal fistula   . Malignant neoplasm of rectum ([place])   . Anemia   . Endometrial cancer ([place])   . GERD (gastroesophageal reflux disease)   . Chronic renal insufficiency   . A-fib [facility])     Past Surgical History  Procedure Laterality Date  . Appendectomy    . Abdominal hysterectomy      Family History  Problem Relation Age of Onset  . Diabetes type II Mother   . Coronary artery disease Father   . Tuberculosis Father   . Diabetes type II Brother   . Liver cancer Son   . Colon cancer Son   . Coronary artery disease Brother   . Breast cancer Daughter    Social History:  reports that she has never smoked. She has never used smokeless tobacco. She reports that she does not drink alcohol or use illicit drugs.  Allergies:  Allergies  Allergen Reactions  . Sulfa Antibiotics Rash  . Meperidine Nausea And Vomiting    Medications:  I have reviewed the patient's current medications. Prior to Admission:  Prescriptions prior to admission  Medication Sig Dispense Refill Last Dose  . acetaminophen  (TYLENOL) 500 MG tablet Take 500-1,000 mg by mouth every 6 (six) hours as needed for mild pain or headache.   09/08/2015 at 1500  . cephALEXin (KEFLEX) 500 MG capsule Take 500 mg by mouth 4 (four) times daily.   09/08/2015 at Unknown time  . Cyanocobalamin (VITAMIN B-12) 6000 MCG SUBL Place 6,000 mcg under the tongue daily.   09/08/2015 at Unknown time  . diltiazem (DILACOR XR) 120 MG 24 hr capsule Take 120 mg by mouth daily.    09/08/2015 at Unknown time  . docusate sodium (STOOL SOFTENER) 100 MG capsule Take 100 mg by mouth 2 (two) times daily.    09/08/2015 at Unknown time  . ferrous sulfate 325 (65 FE) MG tablet Take 325 mg by mouth daily.    09/08/2015 at Unknown time  . furosemide (LASIX) 20 MG tablet Take 20 mg by mouth daily as needed for edema.    PRN at PRN  . HYDROcodone-acetaminophen (NORCO/VICODIN) 5-325 MG tablet Take 1 tablet by mouth 3 (three) times daily. (Patient taking differently: Take 0.5-1 tablets by mouth 3 (three) times daily as needed for moderate pain. ) 30 tablet 0 09/08/2015 at 0900  . hydrocortisone (ANUSOL-HC) 25 MG suppository Place 25 mg rectally 2 (two) times daily as needed for hemorrhoids.    09/07/2015 at Unknown time  .  hydrocortisone 2.5 % cream Apply 1 application topically 2 (two) times daily as needed (for irritation).   09/07/2015 at Unknown time  . omeprazole (PRILOSEC) 20 MG capsule Take 20 mg by mouth daily.    09/08/2015 at Unknown time  . polyethylene glycol (MIRALAX / GLYCOLAX) packet Take 17 g by mouth daily.   09/07/2015 at Unknown time  . potassium chloride (K-DUR) 10 MEQ tablet Take 10 mEq by mouth daily as needed (when taking Lasix).    PRN at PRN  . pravastatin (PRAVACHOL) 20 MG tablet Take 20 mg by mouth at bedtime.   09/07/2015 at Unknown time  . Vitamin D, Ergocalciferol, (DRISDOL) 50000 units CAPS capsule Take 50,000 Units by mouth every 7 (seven) days. Pt takes on Wednesday.   09/08/2015 at unknown    Scheduled: . apixaban  2.5 mg Oral BID  . aspirin   81 mg Oral Daily  . cefTRIAXone (ROCEPHIN)  IV  1 g Intravenous Q24H  . diltiazem  120 mg Oral Daily  . docusate sodium  100 mg Oral BID  . feeding supplement (ENSURE ENLIVE)  237 mL Oral BID BM  . lidocaine  1 patch Transdermal Q24H  . pantoprazole  40 mg Oral QAC breakfast  . polyethylene glycol  17 g Oral Daily  . pravastatin  20 mg Oral QHS    ROS: History obtained from the patient  General ROS: negative for - chills, fatigue, fever, night sweats, weight gain or weight loss Psychological ROS: negative for - behavioral disorder, hallucinations, memory difficulties, mood swings or suicidal ideation Ophthalmic ROS: negative for - blurry vision, double vision, eye pain or loss of vision ENT ROS: negative for - epistaxis, nasal discharge, oral lesions, sore throat, tinnitus or vertigo Allergy and Immunology ROS: negative for - hives or itchy/watery eyes Hematological and Lymphatic ROS: negative for - bleeding problems, bruising or swollen lymph nodes Endocrine ROS: negative for - galactorrhea, hair pattern changes, polydipsia/polyuria or temperature intolerance Respiratory ROS: negative for - cough, hemoptysis, shortness of breath or wheezing Cardiovascular ROS: negative for - chest pain, dyspnea on exertion, edema or irregular heartbeat Gastrointestinal ROS: rectal tube Genito-Urinary ROS: urinary incontinence  Musculoskeletal ROS: left shoulder pain Neurological ROS: as noted in HPI Dermatological ROS: negative for rash and skin lesion changes  Physical Examination: Blood pressure 131/64, pulse 128, temperature 98.2 F (36.8 C), temperature source Oral, resp. rate 18, height 5\' 2"  (1.575 m), weight 58.968 kg (130 lb), SpO2 96 %.  HEENT-  Normocephalic, no lesions, without obvious abnormality.  Normal external eye and conjunctiva.  Normal TM's bilaterally.  Normal auditory canals and external ears. Normal external nose, mucus membranes and septum.  Normal pharynx. Cardiovascular-  S1, S2 normal, pulses palpable throughout   Lungs- chest clear, no wheezing, rales, normal symmetric air entry Abdomen- soft, non-tender; bowel sounds normal; no masses,  no organomegaly Extremities- no edema Lymph-no adenopathy palpable Musculoskeletal-left shoulder tenderness Skin-warm and dry, no hyperpigmentation, vitiligo, or suspicious lesions  Neurological Examination Mental Status: Alert, oriented, thought content appropriate.  Speech fluent without evidence of aphasia.  Able to follow 3 step commands without difficulty. Cranial Nerves: II: Discs flat bilaterally; Visual fields grossly normal, pupils equal, round, reactive to light and accommodation III,IV, VI: ptosis not present, extra-ocular motions intact bilaterally V,VII: smile symmetric, facial light touch sensation decreased on the left VIII: hearing normal bilaterally IX,X: gag reflex present XI: shoulder shrug limited on the left due to pain XII: midline tongue extension Motor: Right : Upper extremity  5/5    Left:     Upper extremity   2/5  Lower extremity   5/5     Lower extremity   3/5 Tone and bulk:normal tone throughout; no atrophy noted Sensory: Pinprick and light touch decreased on the left Deep Tendon Reflexes: 2+ in the upper extremities, 1+ at the knees and absent at the ankles.   Plantars: Right: downgoing   Left: downgoing Cerebellar: normal finger-to-nose and normal heel-to-shin testing on the right.  Unable to perform on the left due to weakness Gait: not tested due to safety concerns   Laboratory Studies:  Basic Metabolic Panel:  Recent Labs Lab 09/08/15 1758 09/10/15 0706  NA 131* 132*  K 3.6 4.1  CL 98* 99*  CO2 24 22  GLUCOSE 160* 126*  BUN 24* 22*  CREATININE 1.11* 1.04*  CALCIUM 8.9 8.6*    Liver Function Tests:  Recent Labs Lab 09/08/15 1758  AST 23  ALT 13*  ALKPHOS 97  BILITOT 0.6  PROT 7.1  ALBUMIN 3.1*   No results for input(s): LIPASE, AMYLASE in the last 168  hours. No results for input(s): AMMONIA in the last 168 hours.  CBC:  Recent Labs Lab 09/08/15 1758 09/10/15 0706  WBC 13.4* 10.3  NEUTROABS 11.9*  --   HGB 12.9 12.3  HCT 38.9 36.7  MCV 88.4 88.9  PLT 285 285    Cardiac Enzymes:  Recent Labs Lab 09/10/15 0706 09/10/15 1057 09/10/15 1645 09/10/15 2247 09/11/15 0517  TROPONINI 0.07* <0.03 <0.03 <0.03 <0.03    BNP: Invalid input(s): POCBNP  CBG: No results for input(s): GLUCAP in the last 168 hours.  Microbiology: Results for orders placed or performed during the hospital encounter of 09/08/15  Urine culture     Status: None   Collection Time: 09/08/15  5:58 PM  Result Value Ref Range Status   Specimen Description URINE, CLEAN CATCH  Final   Special Requests NONE  Final   Culture MULTIPLE SPECIES PRESENT, SUGGEST RECOLLECTION  Final   Report Status 09/11/2015 FINAL  Final  MRSA PCR Screening     Status: None   Collection Time: 09/09/15  1:58 AM  Result Value Ref Range Status   MRSA by PCR NEGATIVE NEGATIVE Final    Comment:        The GeneXpert MRSA Assay (FDA approved for NASAL specimens only), is one component of a comprehensive MRSA colonization surveillance program. It is not intended to diagnose MRSA infection nor to guide or monitor treatment for MRSA infections.     Coagulation Studies:  Recent Labs  09/08/15 1758  LABPROT 14.3  INR 1.09    Urinalysis:  Recent Labs Lab 09/08/15 1758  COLORURINE YELLOW*  LABSPEC 1.019  PHURINE 6.0  GLUCOSEU NEGATIVE  HGBUR 2+*  BILIRUBINUR NEGATIVE  KETONESUR NEGATIVE  PROTEINUR 100*  NITRITE NEGATIVE  LEUKOCYTESUR 2+*    Lipid Panel:    Component Value Date/Time   CHOL 131 09/09/2015 0139   TRIG 163* 09/09/2015 0139   HDL 30* 09/09/2015 0139   CHOLHDL 4.4 09/09/2015 0139   VLDL 33 09/09/2015 0139   LDLCALC 68 09/09/2015 0139    HgbA1C:  Lab Results  Component Value Date   HGBA1C 5.4 09/09/2015    Urine Drug Screen:  No results  found for: LABOPIA, COCAINSCRNUR, LABBENZ, AMPHETMU, THCU, LABBARB  Alcohol Level: No results for input(s): ETH in the last 168 hours.   Imaging: Mr Brain Wo Contrast  09/09/2015  CLINICAL DATA:  Left hemi  paresis. Stroke. She awoke from sleep with left-sided weakness. EXAM: MRI HEAD WITHOUT CONTRAST MRA HEAD WITHOUT CONTRAST TECHNIQUE: Multiplanar, multiecho pulse sequences of the brain and surrounding structures were obtained without intravenous contrast. Angiographic images of the head were obtained using MRA technique without contrast. COMPARISON:  CT head without contrast 09/08/2015 FINDINGS: MRI HEAD FINDINGS An acute nonhemorrhagic 12 mm white matter infarct is present in the right frontal lobe along the cortical spinal tracts. No other acute infarct is present. T2 changes are associated with the infarct. Moderate atrophy and white matter disease is present in addition. Dilated perivascular spaces are present within the basal ganglia bilaterally. White matter changes extend into the brainstem. The cerebellum is unremarkable. The internal auditory canals are within normal limits. Flow is present in the major intracranial arteries. Bilateral lens replacements are present. Globes orbits are otherwise intact. The paranasal sinuses are clear. There is fluid in left mastoid air cells. The skullbase is within normal limits. Midline sagittal images are unremarkable. MRA HEAD FINDINGS The internal carotid arteries demonstrate mild atherosclerotic change without significant stenosis. The left A1 segment is hypoplastic. Both A2 segments fill from the right. There is mild irregularity of the MCA branch vessels bilaterally. There is severe attenuation of flow in the proximal M2 branches bilaterally. This is artifactually exaggerated by patient motion. There is moderate attenuation of flow in the proximal right MCA branch vessels as well more prominent anterior than posterior. The left vertebral artery is the  dominant vessel. The left PICA origin is visualized and normal. Mile atherosclerotic changes are present in the distal left vertebral artery without significant stenosis or occlusion. The basilar artery is intact. Both posterior cerebral arteries are within normal limits. IMPRESSION: 1. Acute nonhemorrhagic 12 mm white matter infarct within the right cortical spinal tracts of the right frontal lobe. 2. Moderate atrophy and white matter disease without other acute infarcts. 3. Moderate attenuation of proximal MCA branch vessels bilaterally is likely exaggerated by patient motion artifact. Electronically Signed   By: San Morelle M.D.   On: 09/09/2015 13:37   Mr Jodene Nam Head/brain Wo Cm  09/09/2015  CLINICAL DATA:  Left hemi paresis. Stroke. She awoke from sleep with left-sided weakness. EXAM: MRI HEAD WITHOUT CONTRAST MRA HEAD WITHOUT CONTRAST TECHNIQUE: Multiplanar, multiecho pulse sequences of the brain and surrounding structures were obtained without intravenous contrast. Angiographic images of the head were obtained using MRA technique without contrast. COMPARISON:  CT head without contrast 09/08/2015 FINDINGS: MRI HEAD FINDINGS An acute nonhemorrhagic 12 mm white matter infarct is present in the right frontal lobe along the cortical spinal tracts. No other acute infarct is present. T2 changes are associated with the infarct. Moderate atrophy and white matter disease is present in addition. Dilated perivascular spaces are present within the basal ganglia bilaterally. White matter changes extend into the brainstem. The cerebellum is unremarkable. The internal auditory canals are within normal limits. Flow is present in the major intracranial arteries. Bilateral lens replacements are present. Globes orbits are otherwise intact. The paranasal sinuses are clear. There is fluid in left mastoid air cells. The skullbase is within normal limits. Midline sagittal images are unremarkable. MRA HEAD FINDINGS The  internal carotid arteries demonstrate mild atherosclerotic change without significant stenosis. The left A1 segment is hypoplastic. Both A2 segments fill from the right. There is mild irregularity of the MCA branch vessels bilaterally. There is severe attenuation of flow in the proximal M2 branches bilaterally. This is artifactually exaggerated by patient motion. There is moderate  attenuation of flow in the proximal right MCA branch vessels as well more prominent anterior than posterior. The left vertebral artery is the dominant vessel. The left PICA origin is visualized and normal. Mile atherosclerotic changes are present in the distal left vertebral artery without significant stenosis or occlusion. The basilar artery is intact. Both posterior cerebral arteries are within normal limits. IMPRESSION: 1. Acute nonhemorrhagic 12 mm white matter infarct within the right cortical spinal tracts of the right frontal lobe. 2. Moderate atrophy and white matter disease without other acute infarcts. 3. Moderate attenuation of proximal MCA branch vessels bilaterally is likely exaggerated by patient motion artifact. Electronically Signed   By: San Morelle M.D.   On: 09/09/2015 13:37    Assessment: 82 y.o. female presenting with left sided weakness and numbness.  MRI of the brain personally reviewed and shows an acute right frontal lobe infarct.  Patient with atrial fibrillation and not on antiplatelet or anticoagulation therapy due to her rectal cancer.  A1c 5.4.  LDL 68.  Carotid dopplers show no hemodynamically significant stenosis.     Stroke Risk Factors - atrial fibrillation, hyperlipidemia and hypertension  Plan: 1. PT consult, OT consult, Speech consult.  Therapy to continue after discharge.   2. Echocardiogram results pending.   3. Prophylactic therapy-Agree with Eliquis 4. Telemetry monitoring 5. Frequent neuro checks   Alexis Goodell, MD Neurology [phone] 09/11/2015, 11:00 AM

## 2015-09-11 NOTE — Progress Notes (Signed)
Clinical Social Worker informed by Bettey Costa, MD that patient is medically ready to discharge to SNF, Patient and  Daughter [name] are in a agreement with plan.  Call to [facility] to confirm that patient's bed is ready. Provided patient's room number 209-B and number to call for report [phone] . All discharge information faxed to  Facility.   RN will call report and patient will discharge to [facility] via EMS.  Casimer Lanius. [place] Work Department [phone] 9:39 AM

## 2015-09-11 NOTE — Progress Notes (Signed)
Patient discharged via EMS. Maninder Deboer S, RN  

## 2015-09-11 NOTE — Progress Notes (Signed)
Report called to [place] at [facility]. Madlyn Frankel

## 2015-09-11 NOTE — Discharge Summary (Addendum)
[facility] [place] NAME: [name]    MR#:  [id number]  DATE OF BIRTH:  Jul 03, 1920  DATE OF ADMISSION:  09/08/2015 ADMITTING PHYSICIAN: Lance Coon, MD  DATE OF DISCHARGE: 09/11/2015 PRIMARY CARE PHYSICIAN: [name], MD    ADMISSION DIAGNOSIS:  UTI (lower urinary tract infection) [N39.0] Left leg weakness [R29.898] Cerebral infarction due to unspecified mechanism [I63.9]  DISCHARGE DIAGNOSIS:  Principal Problem:   Stroke [facility]) Active Problems:   Paroxysmal a-fib (HCC)   HLD (hyperlipidemia)   HTN (hypertension)   GERD (gastroesophageal reflux disease)   CKD (chronic kidney disease), stage III   UTI (lower urinary tract infection)   SECONDARY DIAGNOSIS:   Past Medical History  Diagnosis Date  . Hyperlipidemia   . Hypertension   . 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 Lumbar spondylosis   . Duodenitis   . Colovaginal fistula   . Malignant neoplasm of rectum ([place])   . Anemia   . Endometrial cancer ([place])   . GERD (gastroesophageal reflux disease)   . Chronic renal insufficiency   . A-fib [facility])     HOSPITAL COURSE:  82 year old female with past medical history significant for hypertension, malignant neoplasm of rectum  paroxysmal atrial fibrillation presents to the hospital secondary to left leg weakness and found to have acute stroke.  1. Acute infarct in the right cortical spinal tract and right frontal lobe: MRI showed above finding. Patient was started on aspirin. Due to the patient having a diagnosis of PAF, she was started on Eliquisocon was. Side effects, alternatives and risks were discussed with her. Due to her age and risk of bleeding she wil be discharged on only ELIQUIS and not dial therapy with ASA. She will also continue statin therapy. Physical therapy recommended skilled nursing facility. Carotid Doppler showed no significant stenosis.  2. Left shoulder pain: This is chronic from arthritis.  3.  Urinary tract infection: Patient was on Rocephin and will be discharged on Ceftin. Urine culture was taken and is pending  [id number] heart rate was controlled. Patient is on oral Cardizem. She was started on Eliquis, due to her acute stroke. Patient will follow-up with her cardiologist as outpatient.   5. Hyponatremia: This may be in part due to acute stroke. She will need repeat sodium level with her next PCP visit in one week. DISCHARGE CONDITIONS AND DIET:  Patient is stable for discharge on a heart healthy diet  CONSULTS OBTAINED:  Treatment Team:  Alexis Goodell, MD  DRUG ALLERGIES:   Allergies  Allergen Reactions  . Sulfa Antibiotics Rash  . Meperidine Nausea And Vomiting    DISCHARGE MEDICATIONS:   Current Discharge Medication List    START taking these medications   Details  apixaban (ELIQUIS) 2.5 MG TABS tablet Take 1 tablet (2.5 mg total) by mouth 2 (two) times daily. Qty: 60 tablet, Refills: 0    cefUROXime (CEFTIN) 500 MG tablet Take 1 tablet (500 mg total) by mouth 2 (two) times daily with a meal. Qty: 10 tablet, Refills: 0    feeding supplement, ENSURE ENLIVE, (ENSURE ENLIVE) LIQD Take 237 mLs by mouth 2 (two) times daily between meals. Qty: 237 mL, Refills: 12      CONTINUE these medications which have NOT CHANGED   Details  acetaminophen (TYLENOL) 500 MG tablet Take 500-1,000 mg by mouth every 6 (six) hours as needed for mild pain or headache.    Cyanocobalamin (VITAMIN B-12) 6000 MCG SUBL Place 6,000 mcg  under the tongue daily.    diltiazem (DILACOR XR) 120 MG 24 hr capsule Take 120 mg by mouth daily.     docusate sodium (STOOL SOFTENER) 100 MG capsule Take 100 mg by mouth 2 (two) times daily.     ferrous sulfate 325 (65 FE) MG tablet Take 325 mg by mouth daily.     furosemide (LASIX) 20 MG tablet Take 20 mg by mouth daily as needed for edema.     HYDROcodone-acetaminophen (NORCO/VICODIN) 5-325 MG tablet Take 1 tablet by mouth 3 (three) times  daily. Qty: 30 tablet, Refills: 0    hydrocortisone (ANUSOL-HC) 25 MG suppository Place 25 mg rectally 2 (two) times daily as needed for hemorrhoids.     hydrocortisone 2.5 % cream Apply 1 application topically 2 (two) times daily as needed (for irritation).    omeprazole (PRILOSEC) 20 MG capsule Take 20 mg by mouth daily.     polyethylene glycol (MIRALAX / GLYCOLAX) packet Take 17 g by mouth daily.    potassium chloride (K-DUR) 10 MEQ tablet Take 10 mEq by mouth daily as needed (when taking Lasix).     pravastatin (PRAVACHOL) 20 MG tablet Take 20 mg by mouth at bedtime.    Vitamin D, Ergocalciferol, (DRISDOL) 50000 units CAPS capsule Take 50,000 Units by mouth every 7 (seven) days. Pt takes on Wednesday.      STOP taking these medications     cephALEXin (KEFLEX) 500 MG capsule               Today   CHIEF COMPLAINT:  No issues overnight   VITAL SIGNS:  Blood pressure 131/64, pulse 128, temperature 98.2 F (36.8 C), temperature source Oral, resp. rate 18, height 5\' 2"  (1.575 m), weight 58.968 kg (130 lb), SpO2 96 %.   REVIEW OF SYSTEMS:  Review of Systems  Constitutional: Negative for fever, chills and malaise/fatigue.  HENT: Negative for sore throat.   Eyes: Negative for blurred vision.  Respiratory: Negative for cough, hemoptysis, shortness of breath and wheezing.   Cardiovascular: Negative for chest pain, palpitations and leg swelling.  Gastrointestinal: Negative for nausea, vomiting, abdominal pain, diarrhea and blood in stool.  Genitourinary: Negative for dysuria.  Musculoskeletal: Positive for joint pain (shoulder). Negative for back pain.  Neurological: Positive for focal weakness. Negative for dizziness, tremors, sensory change, speech change and headaches.  Endo/Heme/Allergies: Does not bruise/bleed easily.     PHYSICAL EXAMINATION:  GENERAL:  82 y.o.-year-old patient lying in the bed with no acute distress.  NECK:  Supple, no jugular venous  distention. No thyroid enlargement, no tenderness.  LUNGS: Normal breath sounds bilaterally, no wheezing, rales,rhonchi  No use of accessory muscles of respiration.  CARDIOVASCULAR: irr, irr. No murmurs, rubs, or gallops.  ABDOMEN: Soft, non-tender, non-distended. Bowel sounds present. No organomegaly or mass.  EXTREMITIES: No pedal edema, cyanosis, or clubbing.  PSYCHIATRIC: The patient is alert and oriented x 3.  SKIN: No obvious rash, lesion, or ulcer.  Neuro: Cranial nerves II through XII are intact.LUE 5 out of 5 left lower extreme LLE 4 out of 5. Left upper extremity is limited due to left shoulder pain. DATA REVIEW:   CBC  Recent Labs Lab 09/10/15 0706  WBC 10.3  HGB 12.3  HCT 36.7  PLT 285    Chemistries   Recent Labs Lab 09/08/15 1758 09/10/15 0706  NA 131* 132*  K 3.6 4.1  CL 98* 99*  CO2 24 22  GLUCOSE 160* 126*  BUN 24* 22*  CREATININE  1.11* 1.04*  CALCIUM 8.9 8.6*  AST 23  --   ALT 13*  --   ALKPHOS 97  --   BILITOT 0.6  --     Cardiac Enzymes  Recent Labs Lab 09/10/15 1645 09/10/15 2247 09/11/15 0517  TROPONINI <0.03 <0.03 <0.03    Microbiology Results  @[email or web address] @  RADIOLOGY:  Mr Brain Wo Contrast  09/09/2015  CLINICAL DATA:  Left hemi paresis. Stroke. She awoke from sleep with left-sided weakness. EXAM: MRI HEAD WITHOUT CONTRAST MRA HEAD WITHOUT CONTRAST TECHNIQUE: Multiplanar, multiecho pulse sequences of the brain and surrounding structures were obtained without intravenous contrast. Angiographic images of the head were obtained using MRA technique without contrast. COMPARISON:  CT head without contrast 09/08/2015 FINDINGS: MRI HEAD FINDINGS An acute nonhemorrhagic 12 mm white matter infarct is present in the right frontal lobe along the cortical spinal tracts. No other acute infarct is present. T2 changes are associated with the infarct. Moderate atrophy and white matter disease is present in addition. Dilated perivascular spaces are  present within the basal ganglia bilaterally. White matter changes extend into the brainstem. The cerebellum is unremarkable. The internal auditory canals are within normal limits. Flow is present in the major intracranial arteries. Bilateral lens replacements are present. Globes orbits are otherwise intact. The paranasal sinuses are clear. There is fluid in left mastoid air cells. The skullbase is within normal limits. Midline sagittal images are unremarkable. MRA HEAD FINDINGS The internal carotid arteries demonstrate mild atherosclerotic change without significant stenosis. The left A1 segment is hypoplastic. Both A2 segments fill from the right. There is mild irregularity of the MCA branch vessels bilaterally. There is severe attenuation of flow in the proximal M2 branches bilaterally. This is artifactually exaggerated by patient motion. There is moderate attenuation of flow in the proximal right MCA branch vessels as well more prominent anterior than posterior. The left vertebral artery is the dominant vessel. The left PICA origin is visualized and normal. Mile atherosclerotic changes are present in the distal left vertebral artery without significant stenosis or occlusion. The basilar artery is intact. Both posterior cerebral arteries are within normal limits. IMPRESSION: 1. Acute nonhemorrhagic 12 mm white matter infarct within the right cortical spinal tracts of the right frontal lobe. 2. Moderate atrophy and white matter disease without other acute infarcts. 3. Moderate attenuation of proximal MCA branch vessels bilaterally is likely exaggerated by patient motion artifact. Electronically Signed   By: San Morelle M.D.   On: 09/09/2015 13:37   US Carotid Bilateral  09/09/2015  CLINICAL DATA:  Stroke. EXAM: BILATERAL CAROTID DUPLEX ULTRASOUND TECHNIQUE: Pearline Cables scale imaging, color Doppler and duplex ultrasound were performed of bilateral carotid and vertebral arteries in the neck. COMPARISON:  No  recent prior . FINDINGS: Criteria: Quantification of carotid stenosis is based on velocity parameters that correlate the residual internal carotid diameter with NASCET-based stenosis levels, using the diameter of the distal internal carotid lumen as the denominator for stenosis measurement. The following velocity measurements were obtained: RIGHT ICA:  81/26 cm/sec CCA:  [id number] cm/sec SYSTOLIC ICA/CCA RATIO:  1.6 DIASTOLIC ICA/CCA RATIO:  2.5 ECA:  108 cm/sec LEFT ICA:  81/27 cm/sec CCA:  123456 cm/sec SYSTOLIC ICA/CCA RATIO:  1.2 DIASTOLIC ICA/CCA RATIO:  2.6 ECA:  90 cm/sec RIGHT CAROTID ARTERY: Mild carotid bifurcation and ICAs plaque. No flow limiting stenosis. RIGHT VERTEBRAL ARTERY:  Patent with antegrade flow. LEFT CAROTID ARTERY: Mild left distal common carotid carotid bifurcation and proximal ICA plaque. No flow limiting stenosis. LEFT VERTEBRAL  ARTERY:  Patent antegrade flow. IMPRESSION: 1. Mild bilateral carotid atherosclerotic vascular plaque. No flow limiting stenosis. Degree of stenosis less than 50%. 2. Vertebral arteries are patent antegrade flow. Electronically Signed   By: Marcello Moores  Register   On: 09/09/2015 10:16   Mr Jodene Nam Head/brain Wo Cm  09/09/2015  CLINICAL DATA:  Left hemi paresis. Stroke. She awoke from sleep with left-sided weakness. EXAM: MRI HEAD WITHOUT CONTRAST MRA HEAD WITHOUT CONTRAST TECHNIQUE: Multiplanar, multiecho pulse sequences of the brain and surrounding structures were obtained without intravenous contrast. Angiographic images of the head were obtained using MRA technique without contrast. COMPARISON:  CT head without contrast 09/08/2015 FINDINGS: MRI HEAD FINDINGS An acute nonhemorrhagic 12 mm white matter infarct is present in the right frontal lobe along the cortical spinal tracts. No other acute infarct is present. T2 changes are associated with the infarct. Moderate atrophy and white matter disease is present in addition. Dilated perivascular spaces are present within the  basal ganglia bilaterally. White matter changes extend into the brainstem. The cerebellum is unremarkable. The internal auditory canals are within normal limits. Flow is present in the major intracranial arteries. Bilateral lens replacements are present. Globes orbits are otherwise intact. The paranasal sinuses are clear. There is fluid in left mastoid air cells. The skullbase is within normal limits. Midline sagittal images are unremarkable. MRA HEAD FINDINGS The internal carotid arteries demonstrate mild atherosclerotic change without significant stenosis. The left A1 segment is hypoplastic. Both A2 segments fill from the right. There is mild irregularity of the MCA branch vessels bilaterally. There is severe attenuation of flow in the proximal M2 branches bilaterally. This is artifactually exaggerated by patient motion. There is moderate attenuation of flow in the proximal right MCA branch vessels as well more prominent anterior than posterior. The left vertebral artery is the dominant vessel. The left PICA origin is visualized and normal. Mile atherosclerotic changes are present in the distal left vertebral artery without significant stenosis or occlusion. The basilar artery is intact. Both posterior cerebral arteries are within normal limits. IMPRESSION: 1. Acute nonhemorrhagic 12 mm white matter infarct within the right cortical spinal tracts of the right frontal lobe. 2. Moderate atrophy and white matter disease without other acute infarcts. 3. Moderate attenuation of proximal MCA branch vessels bilaterally is likely exaggerated by patient motion artifact. Electronically Signed   By: San Morelle M.D.   On: 09/09/2015 13:37      Management plans discussed with the patient and she is in agreement. Stable for discharge   Patient should follow up with PCP in 1 week  BMP at that time and CBC  CODE STATUS:     Code Status Orders        Start     Ordered   09/08/15 2303  Full code    Continuous     09/08/15 2302    Code Status History    Date Active Date Inactive Code Status Order ID Comments User Context   This patient has a current code status but no historical code status.    Advance Directive Documentation        Most Recent Value   Type of Advance Directive  Healthcare Power of Attorney   Pre-existing out of facility DNR order (yellow form or pink MOST form)     "MOST" Form in Place?        TOTAL TIME TAKING CARE OF THIS PATIENT: 35 minutes.    Note: This dictation was prepared with Dragon dictation along  with smaller phrase technology. Any transcriptional errors that result from this process are unintentional.  Kayin Kettering M.D on 09/11/2015 at 7:41 AM  Between 7am to 6pm - Pager - [phone] After 6pm go to [email or web address] - password [email or web address] [facility]  Office  [phone]  CC: Primary care physician; [name], MD

## 2015-09-11 NOTE — Progress Notes (Signed)
EMS called for transport. [name]

## 2015-09-11 NOTE — Clinical Social Work Placement (Signed)
   CLINICAL SOCIAL WORK PLACEMENT  NOTE  Date:  09/11/2015  Patient Details  Name: [name] MRN: [id number] Date of Birth: 1919/11/01  Clinical Social Work is seeking post-discharge placement for this patient at the [place] level of care (*CSW will initial, date and re-position this form in  chart as items are completed):  Yes   Patient/family provided with [facility] [place] Work Department's list of facilities offering this level of care within the geographic area requested by the patient (or if unable, by the patient's family).  Yes   Patient/family informed of their freedom to choose among providers that offer the needed level of care, that participate in Medicare, Medicaid or managed care program needed by the patient, have an available bed and are willing to accept the patient.  Yes   Patient/family informed of [facility]'s ownership interest in [facility] and [facility], as well as of the fact that they are under no obligation to receive care at these facilities.  PASRR submitted to EDS on       PASRR number received on       Existing PASRR number confirmed on 09/10/15     FL2 transmitted to all facilities in geographic area requested by pt/family on 09/10/15     FL2 transmitted to all facilities within larger geographic area on       Patient informed that his/her managed care company has contracts with or will negotiate with certain facilities, including the following:        Yes   Patient/family informed of bed offers received.  Patient chooses bed at [facility]     Physician recommends and patient chooses bed at  [facility])    Patient to be transferred to  [facility]) on 09/11/15.  Patient to be transferred to facility by  (EMS)     Patient family notified on 09/11/15 of transfer.  Name of family member notified:   (Daughter Debbie)     PHYSICIAN       Additional Comment:     _______________________________________________ Maurine Cane, LCSW 09/11/2015, 9:37 AM

## 2015-09-16 DIAGNOSIS — E871 Hypo-osmolality and hyponatremia: Secondary | ICD-10-CM | POA: Diagnosis not present

## 2015-09-16 LAB — BASIC METABOLIC PANEL
ANION GAP: 10 (ref 5–15)
BUN: 25 mg/dL — AB (ref 6–20)
CHLORIDE: 97 mmol/L — AB (ref 101–111)
CO2: 29 mmol/L (ref 22–32)
Calcium: 9.1 mg/dL (ref 8.9–10.3)
Creatinine, Ser: 1.03 mg/dL — ABNORMAL HIGH (ref 0.44–1.00)
GFR calc Af Amer: 53 mL/min — ABNORMAL LOW (ref >60)
GFR, EST NON AFRICAN AMERICAN: 45 mL/min — AB (ref >60)
GLUCOSE: 123 mg/dL — AB (ref 65–99)
POTASSIUM: 4.1 mmol/L (ref 3.5–5.1)
SODIUM: 136 mmol/L (ref 135–145)

## 2015-09-22 ENCOUNTER — Encounter
Admission: RE | Admit: 2015-09-22 | Discharge: 2015-09-22 | Disposition: A | Discharge status: Home / Self Care | Payer: Medicare Other | Source: Ambulatory Visit | Attending: Internal Medicine | Admitting: Internal Medicine

## 2015-11-20 ENCOUNTER — Encounter
Admission: RE | Admit: 2015-11-20 | Discharge: 2015-11-20 | Disposition: A | Discharge status: Home / Self Care | Payer: Medicare Other | Source: Ambulatory Visit | Attending: Internal Medicine | Admitting: Internal Medicine

## 2015-11-20 DIAGNOSIS — E876 Hypokalemia: Secondary | ICD-10-CM | POA: Insufficient documentation

## 2015-11-20 DIAGNOSIS — E871 Hypo-osmolality and hyponatremia: Secondary | ICD-10-CM | POA: Insufficient documentation

## 2015-11-20 DIAGNOSIS — D649 Anemia, unspecified: Secondary | ICD-10-CM | POA: Insufficient documentation

## 2015-11-23 ENCOUNTER — Inpatient Hospital Stay: Payer: Medicare Other | Attending: Oncology

## 2015-11-23 ENCOUNTER — Inpatient Hospital Stay (HOSPITAL_BASED_OUTPATIENT_CLINIC_OR_DEPARTMENT_OTHER): Payer: Medicare Other | Admitting: Oncology

## 2015-11-23 VITALS — BP 121/79 | HR 140 | Temp 97.6°F | Resp 20

## 2015-11-23 DIAGNOSIS — Z9221 Personal history of antineoplastic chemotherapy: Secondary | ICD-10-CM | POA: Diagnosis not present

## 2015-11-23 DIAGNOSIS — I129 Hypertensive chronic kidney disease with stage 1 through stage 4 chronic kidney disease, or unspecified chronic kidney disease: Secondary | ICD-10-CM | POA: Diagnosis not present

## 2015-11-23 DIAGNOSIS — C20 Malignant neoplasm of rectum: Secondary | ICD-10-CM

## 2015-11-23 DIAGNOSIS — R5383 Other fatigue: Secondary | ICD-10-CM

## 2015-11-23 DIAGNOSIS — R5381 Other malaise: Secondary | ICD-10-CM | POA: Insufficient documentation

## 2015-11-23 DIAGNOSIS — I4891 Unspecified atrial fibrillation: Secondary | ICD-10-CM | POA: Insufficient documentation

## 2015-11-23 DIAGNOSIS — Z803 Family history of malignant neoplasm of breast: Secondary | ICD-10-CM | POA: Diagnosis not present

## 2015-11-23 DIAGNOSIS — N189 Chronic kidney disease, unspecified: Secondary | ICD-10-CM | POA: Insufficient documentation

## 2015-11-23 DIAGNOSIS — Z7901 Long term (current) use of anticoagulants: Secondary | ICD-10-CM | POA: Insufficient documentation

## 2015-11-23 DIAGNOSIS — G893 Neoplasm related pain (acute) (chronic): Secondary | ICD-10-CM | POA: Insufficient documentation

## 2015-11-23 DIAGNOSIS — Z79899 Other long term (current) drug therapy: Secondary | ICD-10-CM

## 2015-11-23 DIAGNOSIS — R630 Anorexia: Secondary | ICD-10-CM | POA: Diagnosis not present

## 2015-11-23 DIAGNOSIS — R970 Elevated carcinoembryonic antigen [CEA]: Secondary | ICD-10-CM

## 2015-11-23 DIAGNOSIS — E785 Hyperlipidemia, unspecified: Secondary | ICD-10-CM | POA: Insufficient documentation

## 2015-11-23 DIAGNOSIS — Z933 Colostomy status: Secondary | ICD-10-CM

## 2015-11-23 DIAGNOSIS — Z800 Family history of malignant neoplasm of digestive organs: Secondary | ICD-10-CM | POA: Insufficient documentation

## 2015-11-23 DIAGNOSIS — R531 Weakness: Secondary | ICD-10-CM

## 2015-11-23 DIAGNOSIS — K219 Gastro-esophageal reflux disease without esophagitis: Secondary | ICD-10-CM | POA: Insufficient documentation

## 2015-11-23 LAB — CBC WITH DIFFERENTIAL/PLATELET
BASOS PCT: 1 %
Basophils Absolute: 0.2 10*3/uL — ABNORMAL HIGH (ref 0–0.1)
EOS ABS: 0.2 10*3/uL (ref 0–0.7)
Eosinophils Relative: 1 %
HEMATOCRIT: 37.2 % (ref 35.0–47.0)
Hemoglobin: 12.6 g/dL (ref 12.0–16.0)
LYMPHS ABS: 2.4 10*3/uL (ref 1.0–3.6)
Lymphocytes Relative: 13 %
MCH: 30.2 pg (ref 26.0–34.0)
MCHC: 33.8 g/dL (ref 32.0–36.0)
MCV: 89.3 fL (ref 80.0–100.0)
MONOS PCT: 6 %
Monocytes Absolute: 1.1 10*3/uL — ABNORMAL HIGH (ref 0.2–0.9)
NEUTROS ABS: 14.5 10*3/uL — AB (ref 1.4–6.5)
NEUTROS PCT: 79 %
Platelets: 387 10*3/uL (ref 150–440)
RBC: 4.17 MIL/uL (ref 3.80–5.20)
RDW: 16.2 % — ABNORMAL HIGH (ref 11.5–14.5)
WBC: 18.3 10*3/uL — AB (ref 3.6–11.0)

## 2015-11-23 NOTE — Progress Notes (Signed)
[place]  [phone] Fax:([phone]) [phone]  ID: [name] OB: 01-13-1920  MR#: [id number]  [id number]  Patient Care Team: [name], MD as PCP - General (Family Medicine)  CHIEF COMPLAINT:  Chief Complaint  Patient presents with  . Rectal Cancer    INTERVAL HISTORY: Patient return returns to clinic today for routine evaluation and laboratory work. She is having increased rectal pain, but states she is not taking any of her narcotics as prescribed. She only takes Tylenol because narcotics make her feel confused and dizzy. She has a decreased appetite.  She denies any recent fevers. She has no neurologic complaints.  She denies any chest pain or shortness of breath.  She denies any nausea, vomiting, or constipation.  She denies any melena or hematochezia. Patient offers no further specific complaints today.  REVIEW OF SYSTEMS:   Review of Systems  Constitutional: Positive for malaise/fatigue. Negative for fever.  Respiratory: Negative.   Cardiovascular: Negative.   Gastrointestinal: Negative for nausea, abdominal pain, diarrhea, constipation, blood in stool and melena.       Rectal pain  Genitourinary: Negative for dysuria.  Neurological: Positive for weakness.    As per HPI. Otherwise, a complete review of systems is negatve.  PAST MEDICAL HISTORY: Past Medical History  Diagnosis Date  . Hyperlipidemia   . Hypertension   . 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 Lumbar spondylosis   . Duodenitis   . Colovaginal fistula   . Malignant neoplasm of rectum ([place])   . Anemia   . Endometrial cancer ([place])   . GERD (gastroesophageal reflux disease)   . Chronic renal insufficiency   . A-fib [facility])     PAST SURGICAL HISTORY: Past Surgical History  Procedure Laterality Date  . Appendectomy    . Abdominal hysterectomy      FAMILY HISTORY Family History  Problem Relation Age of Onset  . Diabetes type II Mother   . Coronary artery disease  Father   . Tuberculosis Father   . Diabetes type II Brother   . Liver cancer Son   . Colon cancer Son   . Coronary artery disease Brother   . Breast cancer Daughter        ADVANCED DIRECTIVES:    HEALTH MAINTENANCE: Social History  Substance Use Topics  . Smoking status: Never Smoker   . Smokeless tobacco: Never Used  . Alcohol Use: No    Allergies  Allergen Reactions  . Sulfa Antibiotics Rash  . Meperidine Nausea And Vomiting    Current Outpatient Prescriptions  Medication Sig Dispense Refill  . acetaminophen (TYLENOL) 500 MG tablet Take 500-1,000 mg by mouth every 6 (six) hours as needed for mild pain or headache.    Marland Kitchen apixaban (ELIQUIS) 2.5 MG TABS tablet Take 1 tablet (2.5 mg total) by mouth 2 (two) times daily. 60 tablet 0  . Cyanocobalamin (VITAMIN B-12) 6000 MCG SUBL Place 6,000 mcg under the tongue daily.    Marland Kitchen diltiazem (DILACOR XR) 120 MG 24 hr capsule Take 120 mg by mouth daily.     Marland Kitchen docusate sodium (STOOL SOFTENER) 100 MG capsule Take 100 mg by mouth 2 (two) times daily.     . feeding supplement, ENSURE ENLIVE, (ENSURE ENLIVE) LIQD Take 237 mLs by mouth 2 (two) times daily between meals. 237 mL 12  . ferrous sulfate 325 (65 FE) MG tablet Take 325 mg by mouth daily.     . furosemide (LASIX) 20 MG tablet Take 20 mg by mouth  daily as needed for edema.     Marland Kitchen HYDROcodone-acetaminophen (NORCO/VICODIN) 5-325 MG tablet Take 1 tablet by mouth 3 (three) times daily. (Patient taking differently: Take 0.5-1 tablets by mouth 3 (three) times daily as needed for moderate pain. ) 30 tablet 0  . hydrocortisone (ANUSOL-HC) 25 MG suppository Place 25 mg rectally 2 (two) times daily as needed for hemorrhoids.     . hydrocortisone 2.5 % cream Apply 1 application topically 2 (two) times daily as needed (for irritation).    Marland Kitchen omeprazole (PRILOSEC) 20 MG capsule Take 20 mg by mouth daily.     . polyethylene glycol (MIRALAX / GLYCOLAX) packet Take 17 g by mouth daily.    . potassium  chloride (K-DUR) 10 MEQ tablet Take 10 mEq by mouth daily as needed (when taking Lasix).     . pravastatin (PRAVACHOL) 20 MG tablet Take 20 mg by mouth at bedtime.    . Vitamin D, Ergocalciferol, (DRISDOL) 50000 units CAPS capsule Take 50,000 Units by mouth every 7 (seven) days. Pt takes on Wednesday.     No current facility-administered medications for this visit.    OBJECTIVE: Filed Vitals:   11/23/15 1630  BP: 121/79  Pulse: 140  Temp: 97.6 F (36.4 C)  Resp: 20     There is no weight on file to calculate BMI.    ECOG FS:2 - Symptomatic, <50% confined to bed  General: Well-developed, well-nourished, no acute distress. Eyes: Pink conjunctiva, anicteric sclera. Lungs: Clear to auscultation bilaterally. Heart: Regular rate and rhythm. No rubs, murmurs, or gallops. Abdomen: Soft, nontender, nondistended. No organomegaly noted, normoactive bowel sounds. Colostomy in left lower quadrant. Musculoskeletal: No edema, cyanosis, or clubbing. Neuro: Alert, answering all questions appropriately. Cranial nerves grossly intact. Skin: No rashes or petechiae noted. Psych: Normal affect.   LAB RESULTS:  Lab Results  Component Value Date   NA 136 09/16/2015   K 4.1 09/16/2015   CL 97* 09/16/2015   CO2 29 09/16/2015   GLUCOSE 123* 09/16/2015   BUN 25* 09/16/2015   CREATININE 1.03* 09/16/2015   CALCIUM 9.1 09/16/2015   PROT 7.1 09/08/2015   ALBUMIN 3.1* 09/08/2015   AST 23 09/08/2015   ALT 13* 09/08/2015   ALKPHOS 97 09/08/2015   BILITOT 0.6 09/08/2015   GFRNONAA 45* 09/16/2015   GFRAA 53* 09/16/2015    Lab Results  Component Value Date   WBC 18.3* 11/23/2015   NEUTROABS 14.5* 11/23/2015   HGB 12.6 11/23/2015   HCT 37.2 11/23/2015   MCV 89.3 11/23/2015   PLT 387 11/23/2015    Lab Results  Component Value Date   CEA 34.3* 11/23/2015    STUDIES: No results found.  ASSESSMENT: Stage IIa adenocarcinoma of the rectum.  PLAN:   1.  Rectal cancer: Previously, CT scan  results noted with possible residual disease. Given patient's age and performance status, it was determined that no further chemotherapy would be used. CEA is slowly increasing most recent result is 34.3.  Since additional treatments are likely not optimal, no further imaging is necessary. Return to clinic in 4 months with repeat laboratory work and further evaluation.  2. Pain: Patient was encouraged to use her fentanyl patch as prescribed.  3. Decreased appetite: Patient encouraged to eat small bites throughout the day along with boost to get as many calories as possible throughout the day.  Patient expressed understanding and was in agreement with this plan. She also understands that She can call clinic at any time with any questions,  concerns, or complaints.   Approximately 30 minutes was spent in discussion of which greater than 50% was consultation.   Mayra Reel, NP   11/23/2015 5:06 PM  Patient was seen and evaluated independently and I agree with the assessment and plan as dictated above.  Lloyd Huger, MD 11/25/2015 11:17 PM

## 2015-11-23 NOTE — Progress Notes (Signed)
Patient main problem today is the rectum/vaginal pain that is unbearable when sitting.  Her daughter is concerned about patient not eating much.  Her heart rate reading was 147 on blood pressure machine so I checked it on pulse ox also and it ranged from 122-145.

## 2015-11-24 LAB — CEA: CEA: 34.3 ng/mL — AB (ref 0.0–4.7)

## 2015-11-25 DIAGNOSIS — C20 Malignant neoplasm of rectum: Secondary | ICD-10-CM | POA: Insufficient documentation

## 2015-11-26 ENCOUNTER — Other Ambulatory Visit: Payer: Self-pay

## 2015-11-26 ENCOUNTER — Inpatient Hospital Stay
Admission: EM | Admit: 2015-11-26 | Discharge: 2015-11-30 | DRG: 309 | Disposition: A | Discharge status: Skilled Nursing Facility | Payer: Medicare Other | Attending: Internal Medicine | Admitting: Internal Medicine

## 2015-11-26 ENCOUNTER — Encounter: Payer: Self-pay | Admitting: Emergency Medicine

## 2015-11-26 ENCOUNTER — Emergency Department: Payer: Medicare Other

## 2015-11-26 DIAGNOSIS — I10 Essential (primary) hypertension: Secondary | ICD-10-CM | POA: Diagnosis present

## 2015-11-26 DIAGNOSIS — Z800 Family history of malignant neoplasm of digestive organs: Secondary | ICD-10-CM | POA: Diagnosis not present

## 2015-11-26 DIAGNOSIS — I129 Hypertensive chronic kidney disease with stage 1 through stage 4 chronic kidney disease, or unspecified chronic kidney disease: Secondary | ICD-10-CM | POA: Diagnosis present

## 2015-11-26 DIAGNOSIS — E876 Hypokalemia: Secondary | ICD-10-CM | POA: Diagnosis present

## 2015-11-26 DIAGNOSIS — S329XXA Fracture of unspecified parts of lumbosacral spine and pelvis, initial encounter for closed fracture: Secondary | ICD-10-CM | POA: Diagnosis present

## 2015-11-26 DIAGNOSIS — Z882 Allergy status to sulfonamides status: Secondary | ICD-10-CM | POA: Diagnosis not present

## 2015-11-26 DIAGNOSIS — W19XXXA Unspecified fall, initial encounter: Secondary | ICD-10-CM | POA: Diagnosis present

## 2015-11-26 DIAGNOSIS — E871 Hypo-osmolality and hyponatremia: Secondary | ICD-10-CM | POA: Diagnosis present

## 2015-11-26 DIAGNOSIS — K219 Gastro-esophageal reflux disease without esophagitis: Secondary | ICD-10-CM | POA: Diagnosis present

## 2015-11-26 DIAGNOSIS — Z79899 Other long term (current) drug therapy: Secondary | ICD-10-CM

## 2015-11-26 DIAGNOSIS — N390 Urinary tract infection, site not specified: Secondary | ICD-10-CM | POA: Diagnosis present

## 2015-11-26 DIAGNOSIS — Z8249 Family history of ischemic heart disease and other diseases of the circulatory system: Secondary | ICD-10-CM | POA: Diagnosis not present

## 2015-11-26 DIAGNOSIS — N179 Acute kidney failure, unspecified: Secondary | ICD-10-CM | POA: Diagnosis present

## 2015-11-26 DIAGNOSIS — E785 Hyperlipidemia, unspecified: Secondary | ICD-10-CM | POA: Diagnosis present

## 2015-11-26 DIAGNOSIS — Z7901 Long term (current) use of anticoagulants: Secondary | ICD-10-CM

## 2015-11-26 DIAGNOSIS — Z8542 Personal history of malignant neoplasm of other parts of uterus: Secondary | ICD-10-CM

## 2015-11-26 DIAGNOSIS — S32591A Other specified fracture of right pubis, initial encounter for closed fracture: Secondary | ICD-10-CM | POA: Diagnosis present

## 2015-11-26 DIAGNOSIS — I4891 Unspecified atrial fibrillation: Secondary | ICD-10-CM | POA: Diagnosis present

## 2015-11-26 DIAGNOSIS — Z85048 Personal history of other malignant neoplasm of rectum, rectosigmoid junction, and anus: Secondary | ICD-10-CM | POA: Diagnosis not present

## 2015-11-26 DIAGNOSIS — Z9181 History of falling: Secondary | ICD-10-CM

## 2015-11-26 DIAGNOSIS — N183 Chronic kidney disease, stage 3 unspecified: Secondary | ICD-10-CM | POA: Diagnosis present

## 2015-11-26 DIAGNOSIS — Z803 Family history of malignant neoplasm of breast: Secondary | ICD-10-CM

## 2015-11-26 DIAGNOSIS — Y92129 Unspecified place in nursing home as the place of occurrence of the external cause: Secondary | ICD-10-CM | POA: Diagnosis not present

## 2015-11-26 DIAGNOSIS — I482 Chronic atrial fibrillation: Principal | ICD-10-CM | POA: Diagnosis present

## 2015-11-26 DIAGNOSIS — Z96641 Presence of right artificial hip joint: Secondary | ICD-10-CM | POA: Diagnosis present

## 2015-11-26 DIAGNOSIS — Z833 Family history of diabetes mellitus: Secondary | ICD-10-CM

## 2015-11-26 HISTORY — DX: Unspecified inflammatory spondylopathy, lumbar region: M46.96

## 2015-11-26 HISTORY — DX: Effusion, unspecified ankle: M25.473

## 2015-11-26 HISTORY — DX: Diverticulosis of intestine, part unspecified, without perforation or abscess without bleeding: K57.90

## 2015-11-26 HISTORY — DX: Deficiency of other specified B group vitamins: E53.8

## 2015-11-26 HISTORY — DX: Unspecified intestinal obstruction, unspecified as to partial versus complete obstruction: K56.609

## 2015-11-26 HISTORY — DX: Other specified abnormal findings of blood chemistry: R79.89

## 2015-11-26 LAB — BASIC METABOLIC PANEL
ANION GAP: 9 (ref 5–15)
BUN: 25 mg/dL — AB (ref 6–20)
CHLORIDE: 91 mmol/L — AB (ref 101–111)
CO2: 23 mmol/L (ref 22–32)
Calcium: 8 mg/dL — ABNORMAL LOW (ref 8.9–10.3)
Creatinine, Ser: 0.92 mg/dL (ref 0.44–1.00)
GFR calc Af Amer: >60 mL/min (ref >60)
GFR, EST NON AFRICAN AMERICAN: 52 mL/min — AB (ref >60)
GLUCOSE: 130 mg/dL — AB (ref 65–99)
POTASSIUM: 3.2 mmol/L — AB (ref 3.5–5.1)
Sodium: 123 mmol/L — ABNORMAL LOW (ref 135–145)

## 2015-11-26 LAB — TROPONIN I

## 2015-11-26 LAB — PROTIME-INR
INR: 1.43
PROTHROMBIN TIME: 17.5 s — AB (ref 11.4–15.0)

## 2015-11-26 LAB — CBC WITH DIFFERENTIAL/PLATELET
Basophils Absolute: 0.2 10*3/uL — ABNORMAL HIGH (ref 0–0.1)
Basophils Relative: 1 %
EOS ABS: 0.1 10*3/uL (ref 0–0.7)
Eosinophils Relative: 0 %
HCT: 34.6 % — ABNORMAL LOW (ref 35.0–47.0)
Hemoglobin: 11.5 g/dL — ABNORMAL LOW (ref 12.0–16.0)
LYMPHS PCT: 4 %
Lymphs Abs: 0.8 10*3/uL — ABNORMAL LOW (ref 1.0–3.6)
MCH: 29.3 pg (ref 26.0–34.0)
MCHC: 33.2 g/dL (ref 32.0–36.0)
MCV: 88.2 fL (ref 80.0–100.0)
MONO ABS: 1 10*3/uL — AB (ref 0.2–0.9)
Monocytes Relative: 5 %
NEUTROS ABS: 17.8 10*3/uL — AB (ref 1.4–6.5)
NEUTROS PCT: 90 %
PLATELETS: 302 10*3/uL (ref 150–440)
RBC: 3.92 MIL/uL (ref 3.80–5.20)
RDW: 16.3 % — AB (ref 11.5–14.5)
WBC: 19.9 10*3/uL — ABNORMAL HIGH (ref 3.6–11.0)

## 2015-11-26 MED ORDER — DEXTROSE 5 % IV SOLN
5.0000 mg/h | Freq: Once | INTRAVENOUS | Status: AC
  Administered 2015-11-26: 5 mg/h via INTRAVENOUS
  Filled 2015-11-26: qty 100

## 2015-11-26 MED ORDER — FENTANYL CITRATE (PF) 100 MCG/2ML IJ SOLN
25.0000 ug | Freq: Once | INTRAMUSCULAR | Status: AC
  Administered 2015-11-26: 25 ug via INTRAVENOUS
  Filled 2015-11-26: qty 2

## 2015-11-26 MED ORDER — DILTIAZEM HCL 100 MG IV SOLR: 5.0000 mg/h | INTRAVENOUS | Status: DC

## 2015-11-26 MED ORDER — FENTANYL CITRATE (PF) 100 MCG/2ML IJ SOLN
50.0000 ug | Freq: Once | INTRAMUSCULAR | Status: AC
  Administered 2015-11-26: 50 ug via INTRAVENOUS

## 2015-11-26 MED ORDER — DILTIAZEM LOAD VIA INFUSION
10.0000 mg | Freq: Once | INTRAVENOUS | Status: AC
  Administered 2015-11-26: 10 mg via INTRAVENOUS

## 2015-11-26 MED ORDER — SODIUM CHLORIDE 0.9 % IV BOLUS (SEPSIS): 500.0000 mL | Freq: Once | INTRAVENOUS | Status: DC

## 2015-11-26 NOTE — ED Notes (Signed)
Patient to ER for right sided hip pain. Denies fall today, but did have fall two weeks ago. Has h/o hip fracture on same side with hip replacement. Patient appears rather uncomfortable. Patient has been ambulatory since fall two weeks ago. Patient states she walked earlier this morning, but has not been able to walk this evening.

## 2015-11-26 NOTE — ED Provider Notes (Addendum)
[facility] Emergency Department Provider Note  ____________________________________________   I have reviewed the triage vital signs and the nursing notes.   HISTORY  Chief Complaint Hip Pain    HPI [name] is a 82 y.o. female presents today complaining of hip pain. She states she fell 2 weeks ago. Patient has a hip replacement on that side. He states it hurts when she walks and can no longer walk. No recollected recent fall.Does have a history of atrial fibrillation and according to family sometimes and sometimes does not. She has not had any chest pain or shortness of breath and she is at her baseline neurologic status with no recent head injury.    Past Medical History  Diagnosis Date  . Hyperlipidemia   . Hypertension   . 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 Lumbar spondylosis   . Duodenitis   . Colovaginal fistula   . Malignant neoplasm of rectum ([place])   . Anemia   . Endometrial cancer ([place])   . GERD (gastroesophageal reflux disease)   . Chronic renal insufficiency   . A-fib ([place])   . Diverticulosis   . Vitamin B12 deficiency   . Anemia   . Elevated serum creatinine   . Small bowel obstruction ([place])   . Lumbar spondylitis ([place])   . Ankle edema     Patient Active Problem List   Diagnosis Date Noted  . Rectal cancer ([place]) 11/25/2015  . Stroke ([place]) 09/08/2015  . Paroxysmal a-fib ([place]) 09/08/2015  . HLD (hyperlipidemia) 09/08/2015  . HTN (hypertension) 09/08/2015  . GERD (gastroesophageal reflux disease) 09/08/2015  . CKD (chronic kidney disease), stage III 09/08/2015  . UTI (lower urinary tract infection) 09/08/2015    Past Surgical History  Procedure Laterality Date  . Appendectomy    . Abdominal hysterectomy      Current Outpatient Rx  Name  Route  Sig  Dispense  Refill  . acetaminophen (TYLENOL) 500 MG tablet   Oral   Take 500 mg by mouth every 6 (six) hours as needed for mild pain.         Marland Kitchen apixaban (ELIQUIS)  2.5 MG TABS tablet   Oral   Take 1 tablet (2.5 mg total) by mouth 2 (two) times daily.   60 tablet   0   . cholecalciferol (VITAMIN D) 1000 units tablet   Oral   Take 1,000 Units by mouth daily.         . Cyanocobalamin (VITAMIN B-12) 6000 MCG SUBL   Sublingual   Place 6,000 mcg under the tongue daily.         . diclofenac sodium (VOLTAREN) 1 % GEL   Topical   Apply 2 g topically 4 (four) times daily as needed (for left shoulder pain).         Marland Kitchen diltiazem (DILACOR XR) 120 MG 24 hr capsule   Oral   Take 120 mg by mouth daily.          Marland Kitchen docusate sodium (COLACE) 100 MG capsule   Oral   Take 100 mg by mouth 3 (three) times daily as needed for mild constipation.         . feeding supplement, ENSURE ENLIVE, (ENSURE ENLIVE) LIQD   Oral   Take 237 mLs by mouth 2 (two) times daily between meals.   237 mL   12   . ferrous sulfate 325 (65 FE) MG tablet   Oral   Take 325 mg by mouth daily  with breakfast.          . furosemide (LASIX) 20 MG tablet   Oral   Take 20 mg by mouth daily as needed for edema.          Marland Kitchen HYDROcodone-acetaminophen (NORCO/VICODIN) 5-325 MG tablet   Oral   Take 1 tablet by mouth 3 (three) times daily as needed for moderate pain.         . hydrocortisone (ANUSOL-HC) 25 MG suppository   Rectal   Place 25 mg rectally 2 (two) times daily as needed for hemorrhoids.          . hydrocortisone 2.5 % cream   Topical   Apply 1 application topically 2 (two) times daily as needed (for irritation).         Marland Kitchen lidocaine (LMX) 4 % cream   Topical   Apply 1 application topically as needed (for pain).         . nystatin cream (MYCOSTATIN)   Topical   Apply 1 application topically 2 (two) times daily as needed (for yeast).          Marland Kitchen omeprazole (PRILOSEC) 20 MG capsule   Oral   Take 20 mg by mouth daily.          . polyethylene glycol (MIRALAX / GLYCOLAX) packet   Oral   Take 17 g by mouth daily.         . potassium chloride  (K-DUR) 10 MEQ tablet   Oral   Take 10 mEq by mouth daily as needed (when taking Lasix).          . pravastatin (PRAVACHOL) 20 MG tablet   Oral   Take 20 mg by mouth at bedtime.           Allergies Sulfa antibiotics and Meperidine  Family History  Problem Relation Age of Onset  . Diabetes type II Mother   . Coronary artery disease Father   . Tuberculosis Father   . Diabetes type II Brother   . Liver cancer Son   . Colon cancer Son   . Coronary artery disease Brother   . Breast cancer Daughter     Social History Social History  Substance Use Topics  . Smoking status: Never Smoker   . Smokeless tobacco: Never Used  . Alcohol Use: No    Review of Systems Constitutional: No fever/chills Eyes: No visual changes. ENT: No sore throat. No stiff neck no neck pain Cardiovascular: Denies chest pain. Respiratory: Denies shortness of breath. Gastrointestinal:   no vomiting.  No diarrhea.  No constipation. Genitourinary: Negative for dysuria. Musculoskeletal: Negative lower extremity swelling Skin: Negative for rash. Neurological: Negative for headaches, focal weakness or numbness. 10-point ROS otherwise negative.  ____________________________________________   PHYSICAL EXAM:  VITAL SIGNS: ED Triage Vitals  Enc Vitals Group     BP 11/26/15 1954 144/91 mmHg     Pulse Rate 11/26/15 1954 95     Resp 11/26/15 1954 18     Temp 11/26/15 1954 98.3 F (36.8 C)     Temp Source 11/26/15 1954 Oral     SpO2 11/26/15 1954 95 %     Weight 11/26/15 1954 126 lb 8.7 oz (57.4 kg)     Height 11/26/15 1954 5\' 2"  (1.575 m)     Head Cir --      Peak Flow --      Pain Score 11/26/15 1955 10     Pain Loc --  Pain Edu? --      Excl. in [place]? --     Constitutional: Alert and oriented. Well appearing and in no acute distress. Eyes: Conjunctivae are normal. PERRL. EOMI. Head: Atraumatic. Nose: No congestion/rhinnorhea. Mouth/Throat: Mucous membranes are moist.  Oropharynx  non-erythematous. Neck: No stridor.   Nontender with no meningismus Cardiovascular: Normal rate, regular rhythm. Grossly normal heart sounds.  Good peripheral circulation. Respiratory: Normal respiratory effort.  No retractions. Lungs CTAB. Abdominal: Soft and nontender. No distention. No guarding no rebound Back:  There is no focal tenderness or step off there is no midline tenderness there are no lesions noted. there is no CVA tenderness MusculoskeletalPalpation of an ranging of the right hip but no obvious deformity strong distal pulses are noted. No joint effusions, no DVT signs strong distal pulses no edema Neurologic:  Normal speech and language. No gross focal neurologic deficits are appreciated.  Skin:  Skin is warm, dry and intact. No rash noted. Psychiatric: Mood and affect are normal. Speech and behavior are normal.  ____________________________________________   LABS (all labs ordered are listed, but only abnormal results are displayed)  Labs Reviewed  URINALYSIS COMPLETEWITH MICROSCOPIC ([place])  BASIC METABOLIC PANEL  TROPONIN I  CBC WITH DIFFERENTIAL/PLATELET  PROTIME-INR   ____________________________________________  EKG  I personally interpreted any EKGs ordered by me or triage A. fib with RVR rate 147 acute ST elevation or acute ST depression ____________________________________________  RADIOLOGY  I reviewed any imaging ordered by me or triage that were performed during my shift and, if possible, patient and/or family made aware of any abnormal findings. ____________________________________________   PROCEDURES  Procedure(s) performed: None  Critical Care performed: CRITICAL CARE Performed by: Schuyler Amor   Total critical care time: 40 minutes  Critical care time was exclusive of separately billable procedures and treating other patients.  Critical care was necessary to treat or prevent imminent or life-threatening  deterioration.  Critical care was time spent personally by me on the following activities: development of treatment plan with patient and/or surrogate as well as nursing, discussions with consultants, evaluation of patient's response to treatment, examination of patient, obtaining history from patient or surrogate, ordering and performing treatments and interventions, ordering and review of laboratory studies, ordering and review of radiographic studies, pulse oximetry and re-evaluation of patient's condition.   ____________________________________________   INITIAL IMPRESSION / ASSESSMENT AND PLAN / ED COURSE  Pertinent labs & imaging results that were available during my care of the patient were reviewed by me and considered in my medical decision making (see chart for details).  Patient here with hip pain on the inside with artificial hip, she doesn't acutely ramus fracture of unknown causation or duration. No other obvious trauma noted on exam. Patient does have a history of atrial fibrillation and she is currently in that, her vital signs are stable however otherwise. She's not had any chest pain or shortness of breath. We'll check cardiac enzymes as a precaution, I will give her a drip to gradually bring her heart rate down as she is not having any complaints. Ranging from 120s to 140s. Dr. Mack Guise for orthopedic surgery was consulted on this patient, and the hospitalist will admit  ----------------------------------------- 11:07 PM on 11/26/2015 -----------------------------------------  Heart rate still in the 120s to 140s no chest pain, she appears to be averaging somewhat lower than it was upon initial arrival however she does appear dehydrated by labs. Giving her IV fluid, on the drip she is having some response  she is in no distress with good vitals however aside from her mild tachycardia and she is tolerating it well. Given her age we do not wish to be aggressive however I prefer  not to keep her tachycardic for a long period of time. We will give a small bolus of 10 IV diltiazem to see if we can continue to lower her heart rate  ----------------------------------------- 11:29 PM on 11/26/2015 -----------------------------------------  Rate 110, pressure stable. ____________________________________________   FINAL CLINICAL IMPRESSION(S) / ED DIAGNOSES  Final diagnoses:  None      This chart was dictated using voice recognition software.  Despite best efforts to proofread,  errors can occur which can change meaning.     Schuyler Amor, MD 11/26/15 2126  Schuyler Amor, MD 11/26/15 2126  Schuyler Amor, MD 11/26/15 [id number]  Schuyler Amor, MD 11/26/15 2329  Schuyler Amor, MD 11/26/15 [id number]

## 2015-11-26 NOTE — H&P (Signed)
[facility] [place] NAME: [name]    MR#:  [id number]  DATE OF BIRTH:  Jun 25, 1920  DATE OF ADMISSION:  11/26/2015  PRIMARY CARE PHYSICIAN: [name], MD   REQUESTING/REFERRING PHYSICIAN: Burlene Arnt, MD  CHIEF COMPLAINT:   Chief Complaint  Patient presents with  . Hip Pain    HISTORY OF PRESENT ILLNESS:  [name]  is a 82 y.o. female who presents with Hip pain. Patient states that she fell about 2 weeks ago, but over the last 24 hours so has had increasing hip pain to the point that she is no longer able to bear weight without severe pain. Here in the ED she was found to have a pelvic fracture. She is also in A. fib with RVR. She is unable to ambulate. She does have some sensation of palpitations intermittently in her chest, as well as some shortness of breath complaining her A. fib with RVR. Hospitals were called for admission.  PAST MEDICAL HISTORY:   Past Medical History  Diagnosis Date  . Hyperlipidemia   . Hypertension   . 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 Lumbar spondylosis   . Duodenitis   . Colovaginal fistula   . Malignant neoplasm of rectum ([place])   . Anemia   . Endometrial cancer ([place])   . GERD (gastroesophageal reflux disease)   . Chronic renal insufficiency   . A-fib ([place])   . Diverticulosis   . Vitamin B12 deficiency   . Anemia   . Elevated serum creatinine   . Small bowel obstruction ([place])   . Lumbar spondylitis ([place])   . Ankle edema     PAST SURGICAL HISTORY:   Past Surgical History  Procedure Laterality Date  . Appendectomy    . Abdominal hysterectomy      SOCIAL HISTORY:   Social History  Substance Use Topics  . Smoking status: Never Smoker   . Smokeless tobacco: Never Used  . Alcohol Use: No    FAMILY HISTORY:   Family History  Problem Relation Age of Onset  . Diabetes type II Mother   . Coronary artery disease Father   . Tuberculosis Father   . Diabetes type II  Brother   . Liver cancer Son   . Colon cancer Son   . Coronary artery disease Brother   . Breast cancer Daughter     DRUG ALLERGIES:   Allergies  Allergen Reactions  . Sulfa Antibiotics Rash  . Meperidine Nausea And Vomiting    MEDICATIONS AT HOME:   Prior to Admission medications   Medication Sig Start Date End Date Taking? Authorizing Provider  acetaminophen (TYLENOL) 500 MG tablet Take 500 mg by mouth every 6 (six) hours as needed for mild pain.   Yes Historical Provider, MD  apixaban (ELIQUIS) 2.5 MG TABS tablet Take 1 tablet (2.5 mg total) by mouth 2 (two) times daily. 09/10/15  Yes Bettey Costa, MD  cholecalciferol (VITAMIN D) 1000 units tablet Take 1,000 Units by mouth daily.   Yes Historical Provider, MD  Cyanocobalamin (VITAMIN B-12) 6000 MCG SUBL Place 6,000 mcg under the tongue daily.   Yes Historical Provider, MD  diclofenac sodium (VOLTAREN) 1 % GEL Apply 2 g topically 4 (four) times daily as needed (for left shoulder pain).   Yes Historical Provider, MD  diltiazem (DILACOR XR) 120 MG 24 hr capsule Take 120 mg by mouth daily.    Yes Historical Provider, MD  docusate sodium (COLACE) 100 MG  capsule Take 100 mg by mouth 3 (three) times daily as needed for mild constipation.   Yes Historical Provider, MD  feeding supplement, ENSURE ENLIVE, (ENSURE ENLIVE) LIQD Take 237 mLs by mouth 2 (two) times daily between meals. 09/10/15  Yes Bettey Costa, MD  ferrous sulfate 325 (65 FE) MG tablet Take 325 mg by mouth daily with breakfast.    Yes Historical Provider, MD  furosemide (LASIX) 20 MG tablet Take 20 mg by mouth daily as needed for edema.    Yes Historical Provider, MD  HYDROcodone-acetaminophen (NORCO/VICODIN) 5-325 MG tablet Take 1 tablet by mouth 3 (three) times daily as needed for moderate pain.   Yes Historical Provider, MD  hydrocortisone (ANUSOL-HC) 25 MG suppository Place 25 mg rectally 2 (two) times daily as needed for hemorrhoids.    Yes Historical Provider, MD   hydrocortisone 2.5 % cream Apply 1 application topically 2 (two) times daily as needed (for irritation).   Yes Historical Provider, MD  lidocaine (LMX) 4 % cream Apply 1 application topically as needed (for pain).   Yes Historical Provider, MD  nystatin cream (MYCOSTATIN) Apply 1 application topically 2 (two) times daily as needed (for yeast).    Yes Historical Provider, MD  omeprazole (PRILOSEC) 20 MG capsule Take 20 mg by mouth daily.    Yes Historical Provider, MD  polyethylene glycol (MIRALAX / GLYCOLAX) packet Take 17 g by mouth daily.   Yes Historical Provider, MD  potassium chloride (K-DUR) 10 MEQ tablet Take 10 mEq by mouth daily as needed (when taking Lasix).    Yes Historical Provider, MD  pravastatin (PRAVACHOL) 20 MG tablet Take 20 mg by mouth at bedtime.   Yes Historical Provider, MD    REVIEW OF SYSTEMS:  Review of Systems  Constitutional: Negative for fever, chills, weight loss and malaise/fatigue.  HENT: Negative for ear pain, hearing loss and tinnitus.   Eyes: Negative for blurred vision, double vision, pain and redness.  Respiratory: Positive for shortness of breath. Negative for cough and hemoptysis.   Cardiovascular: Positive for palpitations. Negative for chest pain, orthopnea and leg swelling.  Gastrointestinal: Negative for nausea, vomiting, abdominal pain, diarrhea and constipation.  Genitourinary: Negative for dysuria, frequency and hematuria.  Musculoskeletal: Positive for joint pain (hip) and falls. Negative for back pain and neck pain.  Skin:       No acne, rash, or lesions  Neurological: Negative for dizziness, tremors, focal weakness and weakness.  Endo/Heme/Allergies: Negative for polydipsia. Does not bruise/bleed easily.  Psychiatric/Behavioral: Negative for depression. The patient is not nervous/anxious and does not have insomnia.      VITAL SIGNS:   Filed Vitals:   11/26/15 2045 11/26/15 2115 11/26/15 2145 11/26/15 2300  BP: 126/85 129/77 118/72  113/70  Pulse: 87 109 90 77  Temp:      TempSrc:      Resp: 39 41 26 32  Height:      Weight:      SpO2: 98% 100% 92% 94%   Wt Readings from Last 3 Encounters:  11/26/15 57.4 kg (126 lb 8.7 oz)  09/08/15 58.968 kg (130 lb)  04/19/15 59.699 kg (131 lb 9.8 oz)    PHYSICAL EXAMINATION:  Physical Exam  Vitals reviewed. Constitutional: She is oriented to person, place, and time. She appears well-developed and well-nourished. No distress.  HENT:  Head: Normocephalic and atraumatic.  Mouth/Throat: Oropharynx is clear and moist.  Eyes: Conjunctivae and EOM are normal. Pupils are equal, round, and reactive to light. No scleral  icterus.  Neck: Normal range of motion. Neck supple. No JVD present. No thyromegaly present.  Cardiovascular: Intact distal pulses.  Exam reveals no gallop and no friction rub.   No murmur heard. Tachycardic, irregular  Respiratory: Effort normal and breath sounds normal. No respiratory distress. She has no wheezes. She has no rales.  GI: Soft. Bowel sounds are normal. She exhibits no distension. There is no tenderness.  Musculoskeletal: Normal range of motion. She exhibits tenderness (in the pubic region). She exhibits no edema.  No arthritis, no gout  Lymphadenopathy:    She has no cervical adenopathy.  Neurological: She is alert and oriented to person, place, and time. No cranial nerve deficit.  No dysarthria, no aphasia  Skin: Skin is warm and dry. No rash noted. No erythema.  Psychiatric: She has a normal mood and affect. Her behavior is normal. Judgment and thought content normal.    LABORATORY PANEL:   CBC  Recent Labs Lab 11/26/15 2209  WBC 19.9*  HGB 11.5*  HCT 34.6*  PLT 302   ------------------------------------------------------------------------------------------------------------------  Chemistries   Recent Labs Lab 11/26/15 2209  NA 123*  K 3.2*  CL 91*  CO2 23  GLUCOSE 130*  BUN 25*  CREATININE 0.92  CALCIUM 8.0*    ------------------------------------------------------------------------------------------------------------------  Cardiac Enzymes  Recent Labs Lab 11/26/15 2209  TROPONINI <0.03   ------------------------------------------------------------------------------------------------------------------  RADIOLOGY:  Dg Hip Unilat With Pelvis 1v Right  11/26/2015  CLINICAL DATA:  Patient to ER for right sided hip pain. Denies fall today, but did have fall two weeks ago. Has h/o hip fracture on same side with hip replacement. Patient appears rather uncomfortable. Patient has been ambulatory since fall 2 weeks ago. Patient walks earlier this morning but was unable to walk this evening. EXAM: DG HIP (WITH OR WITHOUT PELVIS) 1V RIGHT COMPARISON:  CT, 09/10/2014 FINDINGS: There is a nondisplaced fracture of the inferior right pubic ramus. No other convincing fracture. Right hip pain arthroplasty is well-seated and aligned. SI joints, left hip and symphysis pubis are normally aligned. There is sclerosis on the iliac side of the SI joints, right greater than left, stable. Bones are demineralized. There are numerous pelvic vascular clips from previous surgery. IMPRESSION: 1. Nondisplaced fracture of the inferior right pubic ramus. 2. No other fractures.  No dislocation. Electronically Signed   By: Lajean Manes M.D.   On: 11/26/2015 20:31    EKG:   Orders placed or performed in visit on 11/26/15  . EKG 12-Lead    IMPRESSION AND PLAN:  Principal Problem:   Pelvic fracture ([place]) - on x-ray it is a fracture of the inferior right pubic ramus. We will admit her with pain control and get an orthopedic consult. Active Problems:   Atrial fibrillation with RVR ([place]) - patient was started on Cardizem drip in the ED, we will continue this on admission as well as continuing her home rate controlling medications.   HTN (hypertension) - currently controlled, especially with the above mentioned Cardizem drip. Continue  home meds.   CKD (chronic kidney disease), stage III - currently stable, avoid nephrotoxins   HLD (hyperlipidemia) - continue home meds   GERD (gastroesophageal reflux disease) - home dose PPI  All the records are reviewed and case discussed with ED provider. Management plans discussed with the patient and/or family.  DVT PROPHYLAXIS: Systemic anticoagulation  GI PROPHYLAXIS: PPI  ADMISSION STATUS: Inpatient  CODE STATUS: Full Code Status History    Date Active Date Inactive Code Status Order ID  Comments User Context   09/08/2015 11:02 PM 09/11/2015  5:55 PM Full Code [id number]  Lance Coon, MD Inpatient    Advance Directive Documentation        Most Recent Value   Type of Advance Directive  Healthcare Power of Attorney   Pre-existing out of facility DNR order (yellow form or pink MOST form)     "MOST" Form in Place?        TOTAL TIME TAKING CARE OF THIS PATIENT: 45 minutes.    Adina Puzzo [place] 11/26/2015, 11:24 PM  [facility]  Office  [phone]  CC: Primary care physician; [name], MD

## 2015-11-27 LAB — URINALYSIS COMPLETE WITH MICROSCOPIC (ARMC ONLY)
Bilirubin Urine: NEGATIVE
Glucose, UA: NEGATIVE mg/dL
KETONES UR: NEGATIVE mg/dL
Nitrite: NEGATIVE
PROTEIN: 100 mg/dL — AB
SPECIFIC GRAVITY, URINE: 1.015 (ref 1.005–1.030)
pH: 6 (ref 5.0–8.0)

## 2015-11-27 LAB — BASIC METABOLIC PANEL
Anion gap: 11 (ref 5–15)
BUN: 21 mg/dL — AB (ref 6–20)
CHLORIDE: 95 mmol/L — AB (ref 101–111)
CO2: 19 mmol/L — ABNORMAL LOW (ref 22–32)
CREATININE: 0.82 mg/dL (ref 0.44–1.00)
Calcium: 7.7 mg/dL — ABNORMAL LOW (ref 8.9–10.3)
GFR calc Af Amer: >60 mL/min (ref >60)
GFR calc non Af Amer: 60 mL/min — ABNORMAL LOW (ref >60)
Glucose, Bld: 136 mg/dL — ABNORMAL HIGH (ref 65–99)
Potassium: 3.3 mmol/L — ABNORMAL LOW (ref 3.5–5.1)
SODIUM: 125 mmol/L — AB (ref 135–145)

## 2015-11-27 LAB — CBC
HCT: 35.6 % (ref 35.0–47.0)
Hemoglobin: 11.8 g/dL — ABNORMAL LOW (ref 12.0–16.0)
MCH: 29.4 pg (ref 26.0–34.0)
MCHC: 33.1 g/dL (ref 32.0–36.0)
MCV: 89 fL (ref 80.0–100.0)
PLATELETS: 269 10*3/uL (ref 150–440)
RBC: 4 MIL/uL (ref 3.80–5.20)
RDW: 16.2 % — AB (ref 11.5–14.5)
WBC: 18.1 10*3/uL — AB (ref 3.6–11.0)

## 2015-11-27 MED ORDER — CLOTRIMAZOLE 1 % EX CREA
TOPICAL_CREAM | Freq: Two times a day (BID) | CUTANEOUS | Status: DC
  Administered 2015-11-27 – 2015-11-30 (×8): via TOPICAL
  Filled 2015-11-27: qty 15

## 2015-11-27 MED ORDER — DILTIAZEM HCL 25 MG/5ML IV SOLN: 10.0000 mg | Freq: Four times a day (QID) | INTRAVENOUS | Status: DC | PRN

## 2015-11-27 MED ORDER — APIXABAN 2.5 MG PO TABS
2.5000 mg | ORAL_TABLET | Freq: Two times a day (BID) | ORAL | Status: DC
  Administered 2015-11-27 – 2015-11-30 (×8): 2.5 mg via ORAL
  Filled 2015-11-27 (×8): qty 1

## 2015-11-27 MED ORDER — HYDROMORPHONE HCL 1 MG/ML IJ SOLN
0.5000 mg | INTRAMUSCULAR | Status: DC | PRN
  Administered 2015-11-27: 0.5 mg via INTRAVENOUS
  Filled 2015-11-27: qty 1

## 2015-11-27 MED ORDER — SODIUM CHLORIDE 0.9 % IV SOLN
INTRAVENOUS | Status: DC
  Administered 2015-11-27 – 2015-11-28 (×3): via INTRAVENOUS

## 2015-11-27 MED ORDER — SODIUM CHLORIDE 0.9% FLUSH
3.0000 mL | Freq: Two times a day (BID) | INTRAVENOUS | Status: DC
  Administered 2015-11-27 – 2015-11-29 (×5): 3 mL via INTRAVENOUS

## 2015-11-27 MED ORDER — PANTOPRAZOLE SODIUM 40 MG PO TBEC
40.0000 mg | DELAYED_RELEASE_TABLET | Freq: Every day | ORAL | Status: DC
  Administered 2015-11-27 – 2015-11-30 (×4): 40 mg via ORAL
  Filled 2015-11-27 (×4): qty 1

## 2015-11-27 MED ORDER — DIGOXIN 125 MCG PO TABS
0.0625 mg | ORAL_TABLET | Freq: Every day | ORAL | Status: DC
  Administered 2015-11-27 – 2015-11-30 (×4): 0.0625 mg via ORAL
  Filled 2015-11-27 (×5): qty 1

## 2015-11-27 MED ORDER — NYSTATIN 100000 UNIT/GM EX POWD
Freq: Three times a day (TID) | CUTANEOUS | Status: DC
  Administered 2015-11-27 – 2015-11-30 (×9): via TOPICAL
  Filled 2015-11-27: qty 15

## 2015-11-27 MED ORDER — ONDANSETRON HCL 4 MG PO TABS: 4.0000 mg | ORAL_TABLET | Freq: Four times a day (QID) | ORAL | Status: DC | PRN

## 2015-11-27 MED ORDER — PRAVASTATIN SODIUM 20 MG PO TABS
20.0000 mg | ORAL_TABLET | Freq: Every day | ORAL | Status: DC
  Administered 2015-11-27 – 2015-11-30 (×4): 20 mg via ORAL
  Filled 2015-11-27 (×4): qty 1

## 2015-11-27 MED ORDER — DILTIAZEM HCL 100 MG IV SOLR
5.0000 mg/h | INTRAVENOUS | Status: DC
  Administered 2015-11-27 (×2): 10 mg/h via INTRAVENOUS
  Filled 2015-11-27: qty 100

## 2015-11-27 MED ORDER — ACETAMINOPHEN 325 MG PO TABS: 650.0000 mg | ORAL_TABLET | Freq: Four times a day (QID) | ORAL | Status: DC | PRN

## 2015-11-27 MED ORDER — DILTIAZEM HCL ER COATED BEADS 120 MG PO CP24: 120.0000 mg | ORAL_CAPSULE | Freq: Every day | ORAL | Status: DC

## 2015-11-27 MED ORDER — DEXTROSE 5 % IV SOLN
2.0000 g | Freq: Once | INTRAVENOUS | Status: AC
  Administered 2015-11-27: 2 g via INTRAVENOUS
  Filled 2015-11-27: qty 2

## 2015-11-27 MED ORDER — ACETAMINOPHEN 650 MG RE SUPP: 650.0000 mg | Freq: Four times a day (QID) | RECTAL | Status: DC | PRN

## 2015-11-27 MED ORDER — ONDANSETRON HCL 4 MG/2ML IJ SOLN: 4.0000 mg | Freq: Four times a day (QID) | INTRAMUSCULAR | Status: DC | PRN

## 2015-11-27 MED ORDER — HALOPERIDOL 2 MG PO TABS
2.0000 mg | ORAL_TABLET | Freq: Three times a day (TID) | ORAL | Status: DC | PRN
  Filled 2015-11-27: qty 1

## 2015-11-27 MED ORDER — SODIUM CHLORIDE 0.9 % IV SOLN
INTRAVENOUS | Status: DC
  Administered 2015-11-27: 01:00:00 via INTRAVENOUS

## 2015-11-27 MED ORDER — CEFTRIAXONE SODIUM 2 G IJ SOLR
2.0000 g | INTRAMUSCULAR | Status: DC
  Administered 2015-11-28 – 2015-11-30 (×3): 2 g via INTRAVENOUS
  Filled 2015-11-27 (×4): qty 2

## 2015-11-27 MED ORDER — OXYCODONE-ACETAMINOPHEN 5-325 MG PO TABS
1.0000 | ORAL_TABLET | Freq: Four times a day (QID) | ORAL | Status: DC | PRN
  Administered 2015-11-27 – 2015-11-28 (×4): 1 via ORAL
  Filled 2015-11-27 (×4): qty 1

## 2015-11-27 MED ORDER — HYDROMORPHONE HCL 1 MG/ML IJ SOLN
1.0000 mg | INTRAMUSCULAR | Status: DC | PRN
  Administered 2015-11-27 – 2015-11-29 (×3): 1 mg via INTRAVENOUS
  Filled 2015-11-27 (×3): qty 1

## 2015-11-27 MED ORDER — DILTIAZEM HCL 60 MG PO TABS
60.0000 mg | ORAL_TABLET | Freq: Three times a day (TID) | ORAL | Status: DC
  Administered 2015-11-27 – 2015-11-30 (×12): 60 mg via ORAL
  Filled 2015-11-27 (×12): qty 1

## 2015-11-27 NOTE — Progress Notes (Signed)
Patient is refusing at this time for me to do peri/foley care and apply cream and nystatin. She states "I have had such a long night right now I just can't move". Will try again in about an hour. When arrived in the beginning of the shift patient only had about 10cc of output that was a dark brown color. Patient now has clearer urine and about 30cc has drained. Will attempt to irrigate again once patient allows.

## 2015-11-27 NOTE — Progress Notes (Signed)
Communicated with Dr. Marcille Blanco about patient current pain level ,excoriated labia due to yeast infection, and anxiety New order: clotrimazole, haldol, and increased dilaudid 1mg .

## 2015-11-27 NOTE — ED Notes (Addendum)
Titrated Cardizem to 7.5 mg/hr due to consistent HR > 105

## 2015-11-27 NOTE — Progress Notes (Signed)
Patient's catheter has been leaking all day with little output in the actual catheter. Patient has had two soaked pads with urine today. Paged MD on call and stated okay to take foley out. Now removed and will pass on to night shift to monitor urine output closely.

## 2015-11-27 NOTE — Progress Notes (Signed)
Pharmacy Antibiotic Note  [name] is a 82 y.o. female admitted on 11/26/2015 with UTI.  Pharmacy has been consulted for Ceftriaxone dosing.  Plan: Ceftriaxone 2 grams q 24 hours ordered.  Height: 5\' 2"  (157.5 cm) Weight: 126 lb 3.2 oz (57.244 kg) IBW/kg (Calculated) : 50.1  Temp (24hrs), Avg:98.4 F (36.9 C), Min:98.3 F (36.8 C), Max:98.4 F (36.9 C)   Recent Labs Lab 11/23/15 1533 11/26/15 2209  WBC 18.3* 19.9*  CREATININE  --  0.92    Estimated Creatinine Clearance: 30.2 mL/min (by C-G formula based on Cr of 0.92).    Allergies  Allergen Reactions  . Sulfa Antibiotics Rash  . Meperidine Nausea And Vomiting    Antimicrobials this admission: ceftriaxone  >>    >>   Dose adjustments this admission:   Microbiology results:  4/7 UCx: pending    UA: LE(+) NO2(-) WBC TNTC  Thank you for allowing pharmacy to be a part of this patient's care.  Zionah Criswell S 11/27/2015 3:50 AM

## 2015-11-27 NOTE — Progress Notes (Signed)
Cardizem drip has been turned off. Patient tolerating PO digoxin and cardizem well. Heart rate currently in the 80's in Afib. Will continue to monitor.

## 2015-11-27 NOTE — Progress Notes (Signed)
Communicated  With Dr. Marcille Blanco about patient's decrease output due to sediment in foley.new order irrigation

## 2015-11-27 NOTE — Plan of Care (Signed)
Problem: Safety: Goal: Ability to remain free from injury will improve Outcome: Completed/Met Date Met:  11/27/15 Patient understands that her safety is important while her will continue to montior

## 2015-11-27 NOTE — Consult Note (Signed)
ORTHOPAEDIC CONSULTATION  REQUESTING PHYSICIAN: Dustin Flock, MD  Chief Complaint: Right nondisplaced inferior ramus fracture  HPI: [name] is a 82 y.o. female who is reported to a fallen at her nursing facility. Orthopedics is consulted for the finding of a inferior ramus fracture. Patient is on the telemetry floor for management of atrial fibrillation. I saw the patient and her hospital room this evening. She was tearful and seems confused. She states that she did not know what was going on and states that she was being "doped up" on medication.  Past Medical History  Diagnosis Date  . Hyperlipidemia   . Hypertension   . 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 Lumbar spondylosis   . Duodenitis   . Colovaginal fistula   . Malignant neoplasm of rectum ([place])   . Anemia   . Endometrial cancer ([place])   . GERD (gastroesophageal reflux disease)   . Chronic renal insufficiency   . A-fib ([place])   . Diverticulosis   . Vitamin B12 deficiency   . Anemia   . Elevated serum creatinine   . Small bowel obstruction ([place])   . Lumbar spondylitis ([place])   . Ankle edema    Past Surgical History  Procedure Laterality Date  . Appendectomy    . Abdominal hysterectomy     Social History   Social History  . Marital Status: Married    Spouse Name: N/A  . Number of Children: N/A  . Years of Education: N/A   Social History Main Topics  . Smoking status: Never Smoker   . Smokeless tobacco: Never Used  . Alcohol Use: No  . Drug Use: No  . Sexual Activity: Not Asked   Other Topics Concern  . None   Social History Narrative   Family History  Problem Relation Age of Onset  . Diabetes type II Mother   . Coronary artery disease Father   . Tuberculosis Father   . Diabetes type II Brother   . Liver cancer Son   . Colon cancer Son   . Coronary artery disease Brother   . Breast cancer Daughter    Allergies  Allergen Reactions  . Sulfa Antibiotics Rash  . Meperidine Nausea And Vomiting    Prior to Admission medications   Medication Sig Start Date End Date Taking? Authorizing Provider  acetaminophen (TYLENOL) 500 MG tablet Take 500 mg by mouth every 6 (six) hours as needed for mild pain.   Yes Historical Provider, MD  apixaban (ELIQUIS) 2.5 MG TABS tablet Take 1 tablet (2.5 mg total) by mouth 2 (two) times daily. 09/10/15  Yes Bettey Costa, MD  cholecalciferol (VITAMIN D) 1000 units tablet Take 1,000 Units by mouth daily.   Yes Historical Provider, MD  Cyanocobalamin (VITAMIN B-12) 6000 MCG SUBL Place 6,000 mcg under the tongue daily.   Yes Historical Provider, MD  diclofenac sodium (VOLTAREN) 1 % GEL Apply 2 g topically 4 (four) times daily as needed (for left shoulder pain).   Yes Historical Provider, MD  diltiazem (DILACOR XR) 120 MG 24 hr capsule Take 120 mg by mouth daily.    Yes Historical Provider, MD  docusate sodium (COLACE) 100 MG capsule Take 100 mg by mouth 3 (three) times daily as needed for mild constipation.   Yes Historical Provider, MD  feeding supplement, ENSURE ENLIVE, (ENSURE ENLIVE) LIQD Take 237 mLs by mouth 2 (two) times daily between meals. 09/10/15  Yes Sital Mody, MD  ferrous sulfate 325 (65 FE) MG tablet Take 325 mg  by mouth daily with breakfast.    Yes Historical Provider, MD  furosemide (LASIX) 20 MG tablet Take 20 mg by mouth daily as needed for edema.    Yes Historical Provider, MD  HYDROcodone-acetaminophen (NORCO/VICODIN) 5-325 MG tablet Take 1 tablet by mouth 3 (three) times daily as needed for moderate pain.   Yes Historical Provider, MD  hydrocortisone (ANUSOL-HC) 25 MG suppository Place 25 mg rectally 2 (two) times daily as needed for hemorrhoids.    Yes Historical Provider, MD  hydrocortisone 2.5 % cream Apply 1 application topically 2 (two) times daily as needed (for irritation).   Yes Historical Provider, MD  lidocaine (LMX) 4 % cream Apply 1 application topically as needed (for pain).   Yes Historical Provider, MD  nystatin cream (MYCOSTATIN)  Apply 1 application topically 2 (two) times daily as needed (for yeast).    Yes Historical Provider, MD  omeprazole (PRILOSEC) 20 MG capsule Take 20 mg by mouth daily.    Yes Historical Provider, MD  polyethylene glycol (MIRALAX / GLYCOLAX) packet Take 17 g by mouth daily.   Yes Historical Provider, MD  potassium chloride (K-DUR) 10 MEQ tablet Take 10 mEq by mouth daily as needed (when taking Lasix).    Yes Historical Provider, MD  pravastatin (PRAVACHOL) 20 MG tablet Take 20 mg by mouth at bedtime.   Yes Historical Provider, MD   Dg Hip Unilat With Pelvis 1v Right  11/26/2015  CLINICAL DATA:  Patient to ER for right sided hip pain. Denies fall today, but did have fall two weeks ago. Has h/o hip fracture on same side with hip replacement. Patient appears rather uncomfortable. Patient has been ambulatory since fall 2 weeks ago. Patient walks earlier this morning but was unable to walk this evening. EXAM: DG HIP (WITH OR WITHOUT PELVIS) 1V RIGHT COMPARISON:  CT, 09/10/2014 FINDINGS: There is a nondisplaced fracture of the inferior right pubic ramus. No other convincing fracture. Right hip pain arthroplasty is well-seated and aligned. SI joints, left hip and symphysis pubis are normally aligned. There is sclerosis on the iliac side of the SI joints, right greater than left, stable. Bones are demineralized. There are numerous pelvic vascular clips from previous surgery. IMPRESSION: 1. Nondisplaced fracture of the inferior right pubic ramus. 2. No other fractures.  No dislocation. Electronically Signed   By: Lajean Manes M.D.   On: 11/26/2015 20:31    Positive ROS: All other systems have been reviewed and were otherwise negative with the exception of those mentioned in the HPI and as above.  Physical Exam: General: Awake, no acute distress, but anxious and confused.  MUSCULOSKELETAL: Right Lower Extremity: Patient had no detectable pain with logrolling of her right lower extremity. She had intact sensation  light touch throughout the right lower extremity with palpable pedal pulses. Good intact motor function throughout the right lower extremity.  Assessment: Right inferior pubic ramus fracture  Plan: Patient has a nondisplaced fracture of the inferior pubic ramus on the right side. I reviewed the x-rays personally. This fracture appears stable. Her total hip arthroplasty shows no evidence of loosening or failure. I recommend patient be treated symptomatically for pain. She may weight-bear as tolerated on the right lower extremity. I recommend physical therapy evaluation and she may require skilled nursing facility placement. She may follow up my office in 4-6 weeks for reevaluation.    Thornton Park, MD    11/27/2015 5:44 PM

## 2015-11-27 NOTE — Plan of Care (Signed)
Problem: Education: Goal: Knowledge of [facility] Education information/materials will improve Outcome: Progressing Education provided to patient such as hand outs

## 2015-11-27 NOTE — Progress Notes (Signed)
Approximately around 0215, nurse communicated with Dr.Willis about Patient's + UTI, and leakage coming from the foley, and potassium 3.2. New orders: Re-insert foley, and start ceftriaxone.

## 2015-11-27 NOTE — Progress Notes (Signed)
[facility] at [facility]                                                                                                                                                                                            Patient Demographics   [name], is a 82 y.o. female, DOB - 24-Oct-1919, [id number]  Admit date - 11/26/2015   Admitting Physician Lance Coon, MD  Outpatient Primary MD for the patient is [name], MD   LOS - 1  Subjective: Patient admitted from an assisted living with the fall. She is noted to have leukocytosis. Acute renal failure. Hyponatremia as well as hypokalemia. She also was noted to have atrial fibrillation with rapid normal tracheal rate she is currently on Cardizem drip. Heart rate is improved.    Review of Systems:   CONSTITUTIONAL: No documented fever. Generalized fatigue and weakness. No weight gain, no weight loss.  EYES: No blurry or double vision.  ENT: No tinnitus. No postnasal drip. No redness of the oropharynx.  RESPIRATORY: No cough, no wheeze, no hemoptysis. No dyspnea.  CARDIOVASCULAR: No chest pain. No orthopnea. No palpitations. No syncope.  GASTROINTESTINAL: No nausea, no vomiting or diarrhea. No abdominal pain. No melena or hematochezia.  GENITOURINARY: No dysuria or hematuria.  ENDOCRINE: No polyuria or nocturia. No heat or cold intolerance.  HEMATOLOGY: No anemia. No bruising. No bleeding.  INTEGUMENTARY: No rashes. No lesions.  MUSCULOSKELETAL: Diffuse joint pains. No swelling. No gout.  NEUROLOGIC: No numbness, tingling, or ataxia. No seizure-type activity.  PSYCHIATRIC: No anxiety. No insomnia. No ADD.    Vitals:   Filed Vitals:   11/27/15 0516 11/27/15 0745 11/27/15 0800 11/27/15 0813  BP: 140/67 126/64 120/57 119/64  Pulse: 115   104  Temp: 98.2 F (36.8 C)     TempSrc:      Resp: 18     Height:      Weight:      SpO2: 96%   93%    Wt Readings from Last 3 Encounters:   11/27/15 57.244 kg (126 lb 3.2 oz)  09/08/15 58.968 kg (130 lb)  04/19/15 59.699 kg (131 lb 9.8 oz)    No intake or output data in the 24 hours ending 11/27/15 0833  Physical Exam:   GENERAL: Pleasant-appearing in no apparent distress.  HEAD, EYES, EARS, NOSE AND THROAT: Atraumatic, normocephalic. Extraocular muscles are intact. Pupils equal and reactive to light. Sclerae anicteric. No conjunctival injection. No oro-pharyngeal erythema.  NECK: Supple. There is no jugular venous distention. No bruits, no lymphadenopathy, no thyromegaly.  HEART: Irregularly irregular heart rhythm. No murmurs, no  rubs, no clicks.  LUNGS: Clear to auscultation bilaterally. No rales or rhonchi. No wheezes.  ABDOMEN: Soft, flat, nontender, nondistended. Has good bowel sounds. No hepatosplenomegaly appreciated.  EXTREMITIES: No evidence of any cyanosis, clubbing, or peripheral edema.  +2 pedal and radial pulses bilaterally.  NEUROLOGIC: The patient is alert, awake, and oriented x3 with no focal motor or sensory deficits appreciated bilaterally.  SKIN: Moist and warm with no rashes appreciated.  Psych: Not anxious, depressed LN: No inguinal LN enlargement    Antibiotics   Anti-infectives    Start     Dose/Rate Route Frequency Ordered Stop   11/28/15 1000  cefTRIAXone (ROCEPHIN) 2 g in dextrose 5 % 50 mL IVPB     2 g 100 mL/hr over 30 Minutes Intravenous Every 24 hours 11/27/15 0223     11/27/15 0230  cefTRIAXone (ROCEPHIN) 2 g in dextrose 5 % 50 mL IVPB     2 g 100 mL/hr over 30 Minutes Intravenous  Once 11/27/15 0223 11/27/15 0328      Medications   Scheduled Meds: . apixaban  2.5 mg Oral BID  . [START ON 11/28/2015] cefTRIAXone (ROCEPHIN)  IV  2 g Intravenous Q24H  . clotrimazole   Topical BID  . digoxin  0.0625 mg Oral Daily  . diltiazem  60 mg Oral 3 times per day  . nystatin   Topical TID  . pantoprazole  40 mg Oral Daily  . pravastatin  20 mg Oral QHS  . sodium chloride  500 mL Intravenous  Once  . sodium chloride flush  3 mL Intravenous Q12H   Continuous Infusions: . sodium chloride 75 mL/hr at 11/27/15 0121   PRN Meds:.acetaminophen **OR** acetaminophen, haloperidol, HYDROmorphone (DILAUDID) injection, ondansetron **OR** ondansetron (ZOFRAN) IV   Data Review:   Micro Results No results found for this or any previous visit (from the past 240 hour(s)).  Radiology Reports Dg Hip Unilat With Pelvis 1v Right  11/26/2015  CLINICAL DATA:  Patient to ER for right sided hip pain. Denies fall today, but did have fall two weeks ago. Has h/o hip fracture on same side with hip replacement. Patient appears rather uncomfortable. Patient has been ambulatory since fall 2 weeks ago. Patient walks earlier this morning but was unable to walk this evening. EXAM: DG HIP (WITH OR WITHOUT PELVIS) 1V RIGHT COMPARISON:  CT, 09/10/2014 FINDINGS: There is a nondisplaced fracture of the inferior right pubic ramus. No other convincing fracture. Right hip pain arthroplasty is well-seated and aligned. SI joints, left hip and symphysis pubis are normally aligned. There is sclerosis on the iliac side of the SI joints, right greater than left, stable. Bones are demineralized. There are numerous pelvic vascular clips from previous surgery. IMPRESSION: 1. Nondisplaced fracture of the inferior right pubic ramus. 2. No other fractures.  No dislocation. Electronically Signed   By: Lajean Manes M.D.   On: 11/26/2015 20:31     CBC  Recent Labs Lab 11/23/15 1533 11/26/15 2209 11/27/15 0505  WBC 18.3* 19.9* 18.1*  HGB 12.6 11.5* 11.8*  HCT 37.2 34.6* 35.6  PLT 387 302 269  MCV 89.3 88.2 89.0  MCH 30.2 29.3 29.4  MCHC 33.8 33.2 33.1  RDW 16.2* 16.3* 16.2*  LYMPHSABS 2.4 0.8*  --   MONOABS 1.1* 1.0*  --   EOSABS 0.2 0.1  --   BASOSABS 0.2* 0.2*  --     Chemistries   Recent Labs Lab 11/26/15 2209 11/27/15 0505  NA 123* 125*  K 3.2*  3.3*  CL 91* 95*  CO2 23 19*  GLUCOSE 130* 136*  BUN 25* 21*   CREATININE 0.92 0.82  CALCIUM 8.0* 7.7*   ------------------------------------------------------------------------------------------------------------------ estimated creatinine clearance is 33.9 mL/min (by C-G formula based on Cr of 0.82). ------------------------------------------------------------------------------------------------------------------ No results for input(s): HGBA1C in the last 72 hours. ------------------------------------------------------------------------------------------------------------------ No results for input(s): CHOL, HDL, LDLCALC, TRIG, CHOLHDL, LDLDIRECT in the last 72 hours. ------------------------------------------------------------------------------------------------------------------ No results for input(s): TSH, T4TOTAL, T3FREE, THYROIDAB in the last 72 hours.  Invalid input(s): FREET3 ------------------------------------------------------------------------------------------------------------------ No results for input(s): VITAMINB12, FOLATE, FERRITIN, TIBC, IRON, RETICCTPCT in the last 72 hours.  Coagulation profile  Recent Labs Lab 11/26/15 2209  INR 1.43    No results for input(s): DDIMER in the last 72 hours.  Cardiac Enzymes  Recent Labs Lab 11/26/15 2209  TROPONINI <0.03   ------------------------------------------------------------------------------------------------------------------ Invalid input(s): POCBNP    Assessment & Plan  Patient is a 82 year old white female with fall  1. Hyponatremia suspect due to dehydration: Lasix has been discontinued, sodium with some improvement still very low, continue IV fluids monitor sodium closely  2. Urinary tract infection: Continue IV ceftriaxone and await urine cultures.   3. Hypokalemia I will give her oral potassium replacement  4. Atrial fibrillation with rapid ventricular rate heart rate is improved at this point I will discontinue the IV Cardizem started on Cardizem orally.  Patient on Eliquis [place] fall will evaluate mobility if very unsteady will consider stopping Eliquis  5. Pelvic fracture: At this time will continue pain control as physical therapy to evaluate  6. Hyperlipidemia stable continue pravastatin\  7. Miscellaneous she is already on Eliquis which should be good for DVT prophylaxis.         Code Status Orders        Start     Ordered   11/27/15 0105  Full code   Continuous     11/27/15 0104    Code Status History    Date Active Date Inactive Code Status Order ID Comments User Context   09/08/2015 11:02 PM 09/11/2015  5:55 PM Full Code [id number]  Lance Coon, MD Inpatient    Advance Directive Documentation        Most Recent Value   Type of Advance Directive  Healthcare Power of Attorney   Pre-existing out of facility DNR order (yellow form or pink MOST form)     "MOST" Form in Place?             Consults physical therapy   DVT Prophylaxis  Eliquis  Lab Results  Component Value Date   PLT 269 11/27/2015     Time Spent in minutes   31min  Dustin Flock M.D on 11/27/2015 at 8:33 AM  Between 7am to 6pm - Pager - [phone]  After 6pm go to [email or web address] - password [email or web address] [facility]   Office  [phone]

## 2015-11-28 LAB — CBC
HCT: 31.1 % — ABNORMAL LOW (ref 35.0–47.0)
HEMOGLOBIN: 10.2 g/dL — AB (ref 12.0–16.0)
MCH: 29.2 pg (ref 26.0–34.0)
MCHC: 32.9 g/dL (ref 32.0–36.0)
MCV: 88.6 fL (ref 80.0–100.0)
PLATELETS: 284 10*3/uL (ref 150–440)
RBC: 3.51 MIL/uL — AB (ref 3.80–5.20)
RDW: 16.1 % — ABNORMAL HIGH (ref 11.5–14.5)
WBC: 12.6 10*3/uL — AB (ref 3.6–11.0)

## 2015-11-28 LAB — BASIC METABOLIC PANEL
ANION GAP: 6 (ref 5–15)
BUN: 20 mg/dL (ref 6–20)
CHLORIDE: 101 mmol/L (ref 101–111)
CO2: 23 mmol/L (ref 22–32)
Calcium: 7.9 mg/dL — ABNORMAL LOW (ref 8.9–10.3)
Creatinine, Ser: 0.91 mg/dL (ref 0.44–1.00)
GFR calc non Af Amer: 53 mL/min — ABNORMAL LOW (ref >60)
Glucose, Bld: 109 mg/dL — ABNORMAL HIGH (ref 65–99)
POTASSIUM: 3.5 mmol/L (ref 3.5–5.1)
SODIUM: 130 mmol/L — AB (ref 135–145)

## 2015-11-28 NOTE — Progress Notes (Signed)
[place] [facility]                                                                                                                                                                                            Patient Demographics   [name], is a 82 y.o. female, DOB - [birth year], [id number]  Admit date - 11/26/2015   Admitting Physician Lance Coon, MD  Outpatient Primary MD for the patient is [name], MD   LOS - 2  Subjective: Patient states that she is having intermittent chest pain with moving around. But not at rest. The pain in leg improved. Heart rate is stable  Review of Systems:   CONSTITUTIONAL: No documented fever. Generalized fatigue and weakness. No weight gain, no weight loss.  EYES: No blurry or double vision.  ENT: No tinnitus. No postnasal drip. No redness of the oropharynx.  RESPIRATORY: No cough, no wheeze, no hemoptysis. No dyspnea.  CARDIOVASCULAR: Positive chest pain. No orthopnea. No palpitations. No syncope.  GASTROINTESTINAL: No nausea, no vomiting or diarrhea. No abdominal pain. No melena or hematochezia.  GENITOURINARY: No dysuria or hematuria.  ENDOCRINE: No polyuria or nocturia. No heat or cold intolerance.  HEMATOLOGY: No anemia. No bruising. No bleeding.  INTEGUMENTARY: No rashes. No lesions.  MUSCULOSKELETAL: Diffuse joint pains. No swelling. No gout.  NEUROLOGIC: No numbness, tingling, or ataxia. No seizure-type activity.  PSYCHIATRIC: No anxiety. No insomnia. No ADD.    Vitals:   Filed Vitals:   11/27/15 1051 11/27/15 1149 11/27/15 2032 11/28/15 0428  BP: 142/71 139/67 140/63 125/60  Pulse: 88 98 83 101  Temp:  97.4 F (36.3 C) 99.7 F (37.6 C) 98.1 F (36.7 C)  TempSrc:  Oral Oral Oral  Resp:  18 18 20   Height:      Weight:      SpO2: 99% 95% 94% 95%    Wt Readings from Last 3 Encounters:  11/27/15 57.244 kg (126 lb 3.2 oz)  09/08/15 58.968 kg (130 lb)  04/19/15 59.699 kg (131  lb 9.8 oz)     Intake/Output Summary (Last 24 hours) at 11/28/15 0900 Last data filed at 11/28/15 0510  Gross per 24 hour  Intake 2346.25 ml  Output    150 ml  Net 2196.25 ml    Physical Exam:   GENERAL: Pleasant-appearing in no apparent distress.  HEAD, EYES, EARS, NOSE AND THROAT: Atraumatic, normocephalic. Extraocular muscles are intact. Pupils equal and reactive to light. Sclerae anicteric. No conjunctival injection. No oro-pharyngeal erythema.  NECK: Supple. There is no jugular venous distention. No bruits, no lymphadenopathy, no thyromegaly.  HEART: Irregularly irregular heart rhythm. No murmurs, no rubs, no clicks.  LUNGS: Clear to auscultation bilaterally. No rales or rhonchi. No wheezes.  ABDOMEN: Soft, flat, nontender, nondistended. Has good bowel sounds. No hepatosplenomegaly appreciated.  EXTREMITIES: No evidence of any cyanosis, clubbing, or peripheral edema.  +2 pedal and radial pulses bilaterally.  NEUROLOGIC: The patient is alert, awake, and oriented x3 with no focal motor or sensory deficits appreciated bilaterally.  SKIN: Moist and warm with no rashes appreciated.  Psych: Not anxious, depressed LN: No inguinal LN enlargement    Antibiotics   Anti-infectives    Start     Dose/Rate Route Frequency Ordered Stop   11/28/15 1000  cefTRIAXone (ROCEPHIN) 2 g in dextrose 5 % 50 mL IVPB     2 g 100 mL/hr over 30 Minutes Intravenous Every 24 hours 11/27/15 0223     11/27/15 0230  cefTRIAXone (ROCEPHIN) 2 g in dextrose 5 % 50 mL IVPB     2 g 100 mL/hr over 30 Minutes Intravenous  Once 11/27/15 0223 11/27/15 0328      Medications   Scheduled Meds: . apixaban  2.5 mg Oral BID  . cefTRIAXone (ROCEPHIN)  IV  2 g Intravenous Q24H  . clotrimazole   Topical BID  . digoxin  0.0625 mg Oral Daily  . diltiazem  60 mg Oral 3 times per day  . nystatin   Topical TID  . pantoprazole  40 mg Oral Daily  . pravastatin  20 mg Oral QHS  . sodium chloride  500 mL Intravenous Once   . sodium chloride flush  3 mL Intravenous Q12H   Continuous Infusions: . sodium chloride 75 mL/hr at 11/28/15 0510   PRN Meds:.acetaminophen **OR** acetaminophen, diltiazem, haloperidol, HYDROmorphone (DILAUDID) injection, ondansetron **OR** ondansetron (ZOFRAN) IV, oxyCODONE-acetaminophen   Data Review:   Micro Results No results found for this or any previous visit (from the past 240 hour(s)).  Radiology Reports Dg Hip Unilat With Pelvis 1v Right  11/26/2015  CLINICAL DATA:  Patient to ER for right sided hip pain. Denies fall today, but did have fall two weeks ago. Has h/o hip fracture on same side with hip replacement. Patient appears rather uncomfortable. Patient has been ambulatory since fall 2 weeks ago. Patient walks earlier this morning but was unable to walk this evening. EXAM: DG HIP (WITH OR WITHOUT PELVIS) 1V RIGHT COMPARISON:  CT, 09/10/2014 FINDINGS: There is a nondisplaced fracture of the inferior right pubic ramus. No other convincing fracture. Right hip pain arthroplasty is well-seated and aligned. SI joints, left hip and symphysis pubis are normally aligned. There is sclerosis on the iliac side of the SI joints, right greater than left, stable. Bones are demineralized. There are numerous pelvic vascular clips from previous surgery. IMPRESSION: 1. Nondisplaced fracture of the inferior right pubic ramus. 2. No other fractures.  No dislocation. Electronically Signed   By: Lajean Manes M.D.   On: 11/26/2015 20:31     CBC  Recent Labs Lab 11/23/15 1533 11/26/15 2209 11/27/15 0505 11/28/15 0440  WBC 18.3* 19.9* 18.1* 12.6*  HGB 12.6 11.5* 11.8* 10.2*  HCT 37.2 34.6* 35.6 31.1*  PLT 387 302 269 284  MCV 89.3 88.2 89.0 88.6  MCH 30.2 29.3 29.4 29.2  MCHC 33.8 33.2 33.1 32.9  RDW 16.2* 16.3* 16.2* 16.1*  LYMPHSABS 2.4 0.8*  --   --   MONOABS 1.1* 1.0*  --   --   EOSABS 0.2 0.1  --   --  BASOSABS 0.2* 0.2*  --   --     Chemistries   Recent Labs Lab 11/26/15 2209  11/27/15 0505 11/28/15 0440  NA 123* 125* 130*  K 3.2* 3.3* 3.5  CL 91* 95* 101  CO2 23 19* 23  GLUCOSE 130* 136* 109*  BUN 25* 21* 20  CREATININE 0.92 0.82 0.91  CALCIUM 8.0* 7.7* 7.9*   ------------------------------------------------------------------------------------------------------------------ estimated creatinine clearance is 30.5 mL/min (by C-G formula based on Cr of 0.91). ------------------------------------------------------------------------------------------------------------------ No results for input(s): HGBA1C in the last 72 hours. ------------------------------------------------------------------------------------------------------------------ No results for input(s): CHOL, HDL, LDLCALC, TRIG, CHOLHDL, LDLDIRECT in the last 72 hours. ------------------------------------------------------------------------------------------------------------------ No results for input(s): TSH, T4TOTAL, T3FREE, THYROIDAB in the last 72 hours.  Invalid input(s): FREET3 ------------------------------------------------------------------------------------------------------------------ No results for input(s): VITAMINB12, FOLATE, FERRITIN, TIBC, IRON, RETICCTPCT in the last 72 hours.  Coagulation profile  Recent Labs Lab 11/26/15 2209  INR 1.43    No results for input(s): DDIMER in the last 72 hours.  Cardiac Enzymes  Recent Labs Lab 11/26/15 2209  TROPONINI <0.03   ------------------------------------------------------------------------------------------------------------------ Invalid input(s): POCBNP    Assessment & Plan  Patient is a 82 year old white female with fall  1. Hyponatremia suspect due to dehydration: Sodium continues to be low but improved, continue IV fluids, continue to hold Lasix   2. Urinary tract infection: Continue IV ceftriaxone, urine cultures are not back yet we'll review these once available changed to oral antibiotics once culture  sensitivities are back  3. Hypokalemia resolved past replacement   4. Atrial fibrillation with rapid ventricular rate heart rate is improved  Increase Cardizem to 90 mg 3 times a day Continue Eliquis as doing at home  5. Pelvic fracture: Seen by orthopedics, physical therapy consult pain control  6. Hyperlipidemia stable continue pravastatin   7. Miscellaneous she is already on Eliquis which should be good for DVT prophylaxis.      I will give her oral potassium replacement    Code Status Orders        Start     Ordered   11/27/15 0105  Full code   Continuous     11/27/15 0104    Code Status History    Date Active Date Inactive Code Status Order ID Comments User Context   09/08/2015 11:02 PM 09/11/2015  5:55 PM Full Code [id number]  Lance Coon, MD Inpatient    Advance Directive Documentation        Most Recent Value   Type of Advance Directive  Healthcare Power of Attorney   Pre-existing out of facility DNR order (yellow form or pink MOST form)     "MOST" Form in Place?             Consults physical therapy   DVT Prophylaxis  Eliquis  Lab Results  Component Value Date   PLT 284 11/28/2015     Time Spent in minutes   75min  Dustin Flock M.D on 11/28/2015 at 9:00 AM  Between 7am to 6pm - Pager - [phone]  After 6pm go to [email or web address] - password [email or web address] [facility]   Office  [phone]

## 2015-11-28 NOTE — Plan of Care (Signed)
Problem: Pain Managment: Goal: General experience of comfort will improve Outcome: Progressing Medicated for pain once this shift with prn meds. Remains on bedrest.  Physical therapy consult recommended.  Problem: Skin Integrity: Goal: Risk for impaired skin integrity will decrease Outcome: Not Progressing Perineum with excoriated skin.  Creams and powders applied as ordered.

## 2015-11-28 NOTE — Evaluation (Signed)
Physical Therapy Evaluation Patient Details Name: [name] MRN: [id number] DOB: [birth year] Today's Date: 11/28/2015   History of Present Illness  [name] is a 82 y.o. female who presents with R nondisplaced pelvic fracture, WBAT. in A-fib, hypokalemia, UTI, recent fall with pt being unable to talk about it. PMHx:  frontal lobe infarct, colostomy, SBO, chronic renal insufficiency,   Clinical Impression  Pt is up in chair with significant time and dense cues, will need to go to SNF for short stay to recover more independence prior to home as she is a high fall risk and cannot be up alone as prior to her fall and admission.  Will anticipate her stay to be productive as she is already walking short trips and can clearly remember her PLOF on Rollator, although time line is hazy for recent events leading to admission.    Follow Up Recommendations SNF    Equipment Recommendations  Rolling walker with 5" wheels    Recommendations for Other Services Rehab consult     Precautions / Restrictions Precautions Precautions: Fall Restrictions Weight Bearing Restrictions: No      Mobility  Bed Mobility Overal bed mobility: Needs Assistance Bed Mobility: Supine to Sit     Supine to sit: Min assist     General bed mobility comments: light support under trunk and shifts with RLE just to finish getting to bedside, more over agitation   Transfers Overall transfer level: Needs assistance Equipment used: Rolling walker (2 wheeled);1 person hand held assist Transfers: Sit to/from [facility] Sit to Stand: Min assist;Min guard Stand pivot transfers: Min assist;Min guard       General transfer comment: continual help to stand and cue  Ambulation/Gait Ambulation/Gait assistance: Min assist Ambulation Distance (Feet): 4 Feet Assistive device: Rolling walker (2 wheeled);1 person hand held assist Gait Pattern/deviations: Step-to pattern;Trunk flexed;Wide base  of support;Antalgic Gait velocity: reduced Gait velocity interpretation: Below normal speed for age/gender General Gait Details: shifts off to LLE so struggling to accept weight on RLE to get to chair, slow with verbal encouragement  Stairs            Wheelchair Mobility    Modified Rankin (Stroke Patients Only)       Balance Overall balance assessment: Needs assistance Sitting-balance support: Feet supported Sitting balance-Leahy Scale: Fair     Standing balance support: Bilateral upper extremity supported Standing balance-Leahy Scale: Poor                               Pertinent Vitals/Pain Pain Assessment: Faces Faces Pain Scale: Hurts even more Pain Location: L shoulder and R hip Pain Descriptors / Indicators: Aching;Sore Pain Intervention(s): Limited activity within patient's tolerance;Monitored during session;Premedicated before session;Repositioned    Home Living Family/patient expects to be discharged to:: Private residence Living Arrangements: Spouse/significant other Available Help at Discharge: Family;Available 24 hours/day Type of Home: House Home Access: Level entry     Home Layout: One level Home Equipment: Walker - 4 wheels Additional Comments: uses rollator all the time    Prior Function Level of Independence: Needs assistance   Gait / Transfers Assistance Needed: independent with rollator  ADL's / Homemaking Assistance Needed: daughter assists with bathing and changing colostomy bag  Comments: Independent living; daughter stops by daily and assists with doctor appoints and medication     Hand Dominance   Dominant Hand: Right    Extremity/Trunk Assessment   Upper Extremity Assessment:  Overall WFL for tasks assessed           Lower Extremity Assessment: RLE deficits/detail RLE Deficits / Details: Pt can move but needs assistance to lift from the hip off the bed    Cervical / Trunk Assessment: Kyphotic   Communication   Communication: No difficulties  Cognition Arousal/Alertness: Awake/alert Behavior During Therapy: Agitated Overall Cognitive Status: History of cognitive impairments - at baseline       Memory: Decreased recall of precautions;Decreased short-term memory              General Comments General comments (skin integrity, edema, etc.): Pt has much anxiety and obsesses over her ostomy, and was ab le to redirect to avoid pt pulling it off.  Per nsg the staff are waiting for daughter to bring her ostomy bags as the hospital does not stock them.    Exercises        Assessment/Plan    PT Assessment Patient needs continued PT services  PT Diagnosis Difficulty walking;Acute pain;Altered mental status   PT Problem List Decreased range of motion;Decreased strength;Decreased activity tolerance;Decreased balance;Decreased mobility;Decreased coordination;Decreased cognition;Decreased knowledge of use of DME;Decreased safety awareness;Cardiopulmonary status limiting activity;Decreased skin integrity;Pain  PT Treatment Interventions DME instruction;Gait training;Functional mobility training;Therapeutic activities;Therapeutic exercise;Balance training;Neuromuscular re-education;Cognitive remediation;Patient/family education   PT Goals (Current goals can be found in the Care Plan section) Acute Rehab PT Goals Patient Stated Goal: none stated PT Goal Formulation: Patient unable to participate in goal setting Time For Goal Achievement: 12/12/15 Potential to Achieve Goals: Good    Frequency Min 2X/week   Barriers to discharge Decreased caregiver support (no clear information on husband's ability to assist pt)      Co-evaluation               End of Session   Activity Tolerance: Patient limited by pain;Treatment limited secondary to agitation Patient left: in chair;with call bell/phone within reach;with chair alarm set Nurse Communication: Mobility status;Other (comment)  (plan for dc)         Time: [id number] PT Time Calculation (min) (ACUTE ONLY): 27 min   Charges:   PT Evaluation $PT Eval Moderate Complexity: 1 Procedure PT Treatments $Therapeutic Activity: 8-22 mins   PT G Codes:        Ramond Dial 12/24/2015, 11:18 AM   Mee Hives, PT MS Acute Rehab Dept. Number: [phone] [phone] and [place] [phone]

## 2015-11-29 LAB — BASIC METABOLIC PANEL
Anion gap: 5 (ref 5–15)
BUN: 15 mg/dL (ref 6–20)
CO2: 25 mmol/L (ref 22–32)
CREATININE: 0.81 mg/dL (ref 0.44–1.00)
Calcium: 8 mg/dL — ABNORMAL LOW (ref 8.9–10.3)
Chloride: 103 mmol/L (ref 101–111)
GFR calc Af Amer: >60 mL/min (ref >60)
Glucose, Bld: 111 mg/dL — ABNORMAL HIGH (ref 65–99)
Potassium: 3.3 mmol/L — ABNORMAL LOW (ref 3.5–5.1)
SODIUM: 133 mmol/L — AB (ref 135–145)

## 2015-11-29 LAB — CBC
HCT: 32.7 % — ABNORMAL LOW (ref 35.0–47.0)
Hemoglobin: 10.8 g/dL — ABNORMAL LOW (ref 12.0–16.0)
MCH: 29.7 pg (ref 26.0–34.0)
MCHC: 33.1 g/dL (ref 32.0–36.0)
MCV: 89.7 fL (ref 80.0–100.0)
PLATELETS: 294 10*3/uL (ref 150–440)
RBC: 3.65 MIL/uL — ABNORMAL LOW (ref 3.80–5.20)
RDW: 16 % — AB (ref 11.5–14.5)
WBC: 11.5 10*3/uL — AB (ref 3.6–11.0)

## 2015-11-29 LAB — URINE CULTURE

## 2015-11-29 MED ORDER — POLYETHYLENE GLYCOL 3350 17 G PO PACK
17.0000 g | PACK | Freq: Every day | ORAL | Status: DC
  Administered 2015-11-29 – 2015-11-30 (×2): 17 g via ORAL
  Filled 2015-11-29 (×2): qty 1

## 2015-11-29 MED ORDER — ALPRAZOLAM 0.25 MG PO TABS
0.5000 mg | ORAL_TABLET | Freq: Once | ORAL | Status: AC
  Administered 2015-11-29: 0.5 mg via ORAL
  Filled 2015-11-29: qty 2

## 2015-11-29 MED ORDER — POTASSIUM CHLORIDE CRYS ER 20 MEQ PO TBCR
40.0000 meq | EXTENDED_RELEASE_TABLET | Freq: Once | ORAL | Status: AC
  Administered 2015-11-29: 40 meq via ORAL
  Filled 2015-11-29: qty 2

## 2015-11-29 MED ORDER — DOCUSATE SODIUM 100 MG PO CAPS
100.0000 mg | ORAL_CAPSULE | Freq: Two times a day (BID) | ORAL | Status: DC
  Administered 2015-11-29 – 2015-11-30 (×4): 100 mg via ORAL
  Filled 2015-11-29 (×4): qty 1

## 2015-11-29 NOTE — Clinical Social Work Placement (Signed)
   CLINICAL SOCIAL WORK PLACEMENT  NOTE  Date:  11/29/2015  Patient Details  Name: [name] MRN: [id number] Date of Birth: 1920/01/09  Clinical Social Work is seeking post-discharge placement for this patient at the [place] level of care (*CSW will initial, date and re-position this form in  chart as items are completed):  Yes   Patient/family provided with [place] Work Department's list of facilities offering this level of care within the geographic area requested by the patient (or if unable, by the patient's family).  Yes   Patient/family informed of their freedom to choose among providers that offer the needed level of care, that participate in Medicare, Medicaid or managed care program needed by the patient, have an available bed and are willing to accept the patient.  Yes   Patient/family informed of [place]'s ownership interest in [facility] and [facility], as well as of the fact that they are under no obligation to receive care at these facilities.  PASRR submitted to EDS on       PASRR number received on       Existing PASRR number confirmed on 11/29/15     FL2 transmitted to all facilities in geographic area requested by pt/family on 11/29/15     FL2 transmitted to all facilities within larger geographic area on       Patient informed that his/her managed care company has contracts with or will negotiate with certain facilities, including the following:            Patient/family informed of bed offers received.  Patient chooses bed at       Physician recommends and patient chooses bed at      Patient to be transferred to   on  .  Patient to be transferred to facility by       Patient family notified on   of transfer.  Name of family member notified:        PHYSICIAN       Additional Comment:    _______________________________________________ Loralyn Freshwater, LCSW 11/29/2015, 5:53 PM

## 2015-11-29 NOTE — Progress Notes (Signed)
[facility]  Patient Status: Active (receiving services up to time of hospitalization)  [facility] is providing the following services: PT and OT  If patient discharges after hours, please call [phone].   [name] 11/29/2015, 9:33 AM

## 2015-11-29 NOTE — Care Management Important Message (Signed)
Important Message  Patient Details  Name: Geralda Frasier MRN: [id number] Date of Birth: 1920-08-10   Medicare Important Message Given:  Yes    Juliann Pulse A Akhilesh Sassone 11/29/2015, 11:38 AM

## 2015-11-29 NOTE — Clinical Social Work Note (Signed)
Clinical Social Work Assessment  Patient Details  Name: [name] MRN: [id number] Date of Birth: 11/28/1919  Date of referral:  11/29/15               Reason for consult:  Facility Placement                Permission sought to share information with:  Chartered certified accountant granted to share information::  Yes, Verbal Permission Granted  Name::      [place]::   [place]   Relationship::     Contact Information:     Housing/Transportation Living arrangements for the past 2 months:  [place] of Information:  Adult Children Patient Interpreter Needed:  None Criminal Activity/Legal Involvement Pertinent to Current Situation/Hospitalization:  No - Comment as needed Significant Relationships:  Adult Children, Spouse Lives with:  Spouse Do you feel safe going back to the place where you live?  Yes Need for family participation in patient care:  Yes (Comment)  Care giving concerns:  Patient lives with her husband at [place].    Social Worker assessment / plan:  Holiday representative (CSW) received SNF consult. PT is recommending SNF. CSW contacted patient's daughter [name]. Per daughter patient is a resident at [place] [facility] where she resides with her husband. Per [name] she prefers for patient to return to [facility] with home health if possible. CSW explained that patient was not able to ambulate today and it may be difficult to return to independent living. Daughter is agreeable to SNF search and reported that patient has been to [facility] in January 2017.   FL2 complete and faxed out. CSW will continue to follow and assist as needed.   Employment status:  Disabled (Comment on whether or not currently receiving Disability), Retired Nurse, adult PT Recommendations:  [place] / Referral to community  resources:  [place], Other (Comment Required) ([place] VS. SNF)  Patient/Family's Response to care:  Daughter is agreeable to SNF search.   Patient/Family's Understanding of and Emotional Response to Diagnosis, Current Treatment, and Prognosis:  Daughter was pleasant and thanked CSW for visit.   Emotional Assessment Appearance:  Appears stated age Attitude/Demeanor/Rapport:  Unable to Assess Affect (typically observed):  Unable to Assess Orientation:  Oriented to Self, Oriented to Place, Oriented to Situation Alcohol / Substance use:  Not Applicable Psych involvement (Current and /or in the community):  No (Comment)  Discharge Needs  Concerns to be addressed:  Developmental Concerns Readmission within the last 30 days:  No Current discharge risk:  Dependent with Mobility Barriers to Discharge:  Continued Medical Work up   Elwyn Reach 11/29/2015, 5:54 PM

## 2015-11-29 NOTE — Progress Notes (Signed)
Physical Therapy Treatment Patient Details Name: Winslow Prindle MRN: [id number] DOB: 14-Jan-1920 Today's Date: 11/29/2015    History of Present Illness Dula Castorina is a 82 y.o. female who presents with R nondisplaced pelvic fracture, WBAT. in A-fib, hypokalemia, UTI, recent fall with pt being unable to talk about it. PMHx:  frontal lobe infarct, colostomy, SBO, chronic renal insufficiency,     PT Comments    Pt was mod assist x2 for bed mobility.  Pt was mod assist for three sets of sit to stand with RW (2 sets x1 assistance, 1 set x2 assistance).  Pt was successful to stand on two sets of sit to stand; but not on the third set.  Pt was mod assist x1 for stand pivot transfer.  Pt appeared to display increased anxiety as session progressed.  Session was modified due to fatigue.  Next session PT will attempt ambulation.      Follow Up Recommendations  SNF     Equipment Recommendations  Rolling walker with 5" wheels    Recommendations for Other Services       Precautions / Restrictions Precautions Precautions: Fall Restrictions Weight Bearing Restrictions: Yes Other Position/Activity Restrictions: WBAT     Mobility  Bed Mobility Overal bed mobility: Needs Assistance Bed Mobility: Sit to Sidelying         Sit to sidelying: Mod assist;+2 for physical assistance General bed mobility comments: increased time, assist with trunk and LE   Transfers Overall transfer level: Needs assistance Equipment used: Rolling walker (2 wheeled) Transfers: Sit to/from [employer] Transfers (3 sets ) Sit to Stand: Mod assist;+2 physical assistance (3 sets, 2 sets successful, third set not successful; 2 sets x1 assistance, 1 set x2 assistance ) Stand pivot transfers: Mod assist       General transfer comment: increased time, VC's and tactile cues for hand and trunk placement   Ambulation/Gait             General Gait Details: attempted but was not performed due to  fatigue    Stairs            Wheelchair Mobility    Modified Rankin (Stroke Patients Only)       Balance Overall balance assessment: Needs assistance Sitting-balance support: Feet supported Sitting balance-Leahy Scale: Fair     Standing balance support: Bilateral upper extremity supported (RW ) Standing balance-Leahy Scale: Poor                      Cognition Arousal/Alertness: Awake/alert Behavior During Therapy: Anxious Overall Cognitive Status: No family/caregiver present to determine baseline cognitive functioning       Memory: Decreased short-term memory (oriented to person, place and president, did not know year )              Exercises      General Comments   Nursing was contacted and cleared pt for physical therapy.  Pt was agreeable and session was modified due to fatigue.        Pertinent Vitals/Pain Pain Assessment: 0-10 Pain Score: 7  Pain Location: L shoulder and R hip  Pain Descriptors / Indicators: Aching;Grimacing;Crying Pain Intervention(s): Limited activity within patient's tolerance;Monitored during session;Repositioned  See flow sheet for vitals.     Home Living                      Prior Function            PT Goals (current  goals can now be found in the care plan section) Acute Rehab PT Goals Patient Stated Goal: to go home  PT Goal Formulation: With patient Time For Goal Achievement: 12/13/15 Potential to Achieve Goals: Good Progress towards PT goals: Progressing toward goals    Frequency  7X/week    PT Plan      Co-evaluation             End of Session Equipment Utilized During Treatment: Gait belt Activity Tolerance: Patient limited by fatigue       Time: [id number] PT Time Calculation (min) (ACUTE ONLY): 29 min  Charges:                       G Codes:      Mittie Bodo, SPT  Mittie Bodo 11/29/2015, 4:20 PM

## 2015-11-29 NOTE — NC FL2 (Signed)
[place] LEVEL OF CARE SCREENING TOOL     IDENTIFICATION  Patient Name: [name] Birthdate: 04/22/1920 Sex: female Admission Date (Current Location): 11/26/2015  [place] and [place] Number:  Engineering geologist and Address:  [facility], [address], [place], [place] [address]      Provider Number: [id number]  Attending Physician Name and Address:  Dustin Flock, MD  Relative Name and Phone Number:       Current Level of Care: Hospital Recommended Level of Care: [place] Prior Approval Number:    Date Approved/Denied:   PASRR Number:  [id number]  Discharge Plan: SNF    Current Diagnoses: Patient Active Problem List   Diagnosis Date Noted  . Atrial fibrillation with RVR ([place]) 11/26/2015  . Pelvic fracture ([place]) 11/26/2015  . Rectal cancer ([place]) 11/25/2015  . Stroke ([place]) 09/08/2015  . Paroxysmal a-fib ([place]) 09/08/2015  . HLD (hyperlipidemia) 09/08/2015  . HTN (hypertension) 09/08/2015  . GERD (gastroesophageal reflux disease) 09/08/2015  . CKD (chronic kidney disease), stage III 09/08/2015  . UTI (lower urinary tract infection) 09/08/2015    Orientation RESPIRATION BLADDER Height & Weight     Oriented X3 Disoriented to time  O2 (3 litre) Incontinent Weight: 126 lb 3.2 oz (57.244 kg) Height:  5\' 2"  (157.5 cm)  BEHAVIORAL SYMPTOMS/MOOD NEUROLOGICAL BOWEL Colostomy NUTRITION STATUS      Incontinent Diet (regular)  AMBULATORY STATUS COMMUNICATION OF NEEDS Skin   Extensive Assist Verbally Normal                       Personal Care Assistance Level of Assistance  Bathing, Feeding, Dressing, Total care Bathing Assistance: Maximum assistance Feeding assistance: Limited assistance Dressing Assistance: Maximum assistance Total Care Assistance: Maximum assistance   Functional Limitations Info  Sight, Hearing, Speech Sight Info: Adequate Hearing Info: Impaired Speech Info: Adequate     SPECIAL CARE FACTORS FREQUENCY                       Contractures      Additional Factors Info  Code Status Code Status Info: FULL             Current Medications (11/29/2015):  This is the current hospital active medication list Current Facility-Administered Medications  Medication Dose Route Frequency Provider Last Rate Last Dose  . acetaminophen (TYLENOL) tablet 650 mg  650 mg Oral Q6H PRN Lance Coon, MD       Or  . acetaminophen (TYLENOL) suppository 650 mg  650 mg Rectal Q6H PRN Lance Coon, MD      . apixaban [facility]) tablet 2.5 mg  2.5 mg Oral BID Lance Coon, MD   2.5 mg at 11/29/15 0900  . cefTRIAXone (ROCEPHIN) 2 g in dextrose 5 % 50 mL IVPB  2 g Intravenous Q24H Lance Coon, MD   2 g at 11/29/15 0901  . clotrimazole (LOTRIMIN) 1 % cream   Topical BID Harrie Foreman, MD      . digoxin [facility]) tablet 0.0625 mg  0.0625 mg Oral Daily Dustin Flock, MD   0.0625 mg at 11/29/15 0900  . diltiazem (CARDIZEM) injection 10 mg  10 mg Intravenous Q6H PRN Dustin Flock, MD      . diltiazem (CARDIZEM) tablet 60 mg  60 mg Oral 3 times per day Dustin Flock, MD   60 mg at 11/29/15 1315  . docusate sodium (COLACE) capsule 100 mg  100 mg Oral BID  Dustin Flock, MD   100 mg at 11/29/15 0900  . haloperidol (HALDOL) tablet 2 mg  2 mg Oral Q8H PRN Harrie Foreman, MD      . HYDROmorphone (DILAUDID) injection 1 mg  1 mg Intravenous Q3H PRN Harrie Foreman, MD   1 mg at 11/29/15 0901  . nystatin (MYCOSTATIN/NYSTOP) topical powder   Topical TID Harrie Foreman, MD      . ondansetron [facility]) tablet 4 mg  4 mg Oral Q6H PRN Lance Coon, MD       Or  . ondansetron [facility]) injection 4 mg  4 mg Intravenous Q6H PRN Lance Coon, MD      . oxyCODONE-acetaminophen (PERCOCET/ROXICET) 5-325 MG per tablet 1 tablet  1 tablet Oral Q6H PRN Dustin Flock, MD   1 tablet at 11/28/15 2142  . pantoprazole (PROTONIX) EC tablet 40 mg  40 mg Oral Daily Lance Coon, MD   40 mg at  11/29/15 0900  . polyethylene glycol (MIRALAX / GLYCOLAX) packet 17 g  17 g Oral Daily Dustin Flock, MD   17 g at 11/29/15 0900  . pravastatin (PRAVACHOL) tablet 20 mg  20 mg Oral QHS Lance Coon, MD   20 mg at 11/28/15 2142  . sodium chloride 0.9 % bolus 500 mL  500 mL Intravenous Once Schuyler Amor, MD   500 mL at 11/26/15 2300  . sodium chloride flush (NS) 0.9 % injection 3 mL  3 mL Intravenous Q12H Lance Coon, MD   3 mL at 11/29/15 0901     Discharge Medications: Please see discharge summary for a list of discharge medications.  Relevant Imaging Results:  Relevant Lab Results:   Additional Information SSN [id number]  Joana Reamer, [place]

## 2015-11-30 MED ORDER — DIGOXIN 62.5 MCG PO TABS: 0.0625 mg | ORAL_TABLET | Freq: Every day | ORAL | Status: AC

## 2015-11-30 MED ORDER — HYDROCODONE-ACETAMINOPHEN 5-325 MG PO TABS: 1.0000 | ORAL_TABLET | Freq: Three times a day (TID) | ORAL | Status: AC | PRN

## 2015-11-30 MED ORDER — ACETAMINOPHEN 325 MG PO TABS: 650.0000 mg | ORAL_TABLET | Freq: Four times a day (QID) | ORAL | Status: DC | PRN

## 2015-11-30 MED ORDER — DILTIAZEM HCL ER 180 MG PO CP24: 180.0000 mg | ORAL_CAPSULE | Freq: Every day | ORAL | Status: AC

## 2015-11-30 MED ORDER — POTASSIUM CHLORIDE CRYS ER 20 MEQ PO TBCR
40.0000 meq | EXTENDED_RELEASE_TABLET | Freq: Once | ORAL | Status: AC
  Administered 2015-11-30: 40 meq via ORAL
  Filled 2015-11-30: qty 2

## 2015-11-30 NOTE — Progress Notes (Signed)
Clinical Education officer, museum (CSW) contacted patient's daughter [name] and presented bed offers. Per daughter she would like for patient to walk with PT today to see if she can come home with home health. Per PT patient did worse today then yesterday and was not able to ambulate. CSW made daughter aware of patient's progress with PT today. Daughter chose for patient to go to [facility] for [employer]. Per Maudie Mercury admissions coordinator at [facility] they can accept patient today if daughter brings colostomy supplies for patient. Daughter reported that she would bring colostomy supplies to [place] today. Per Maudie Mercury patient will go to room 216-B. RN will call report and arrange EMS for transport. CSW sent D/C Summary, FL2 and D/C Packet to [employer] via [place]. Patient is aware of above. Please reconsult if future social work needs arise. CSW signing off.   Blima Rich, LCSW [phone]

## 2015-11-30 NOTE — Care Management (Signed)
TC to daughter, [name], regarding discharge plan. CSW had stated she did not want her mom to go to SNF. Patient from [facility] where she lives with her husband. Primary nurse states patient has received no pain meds or sedatives over night or today. Spoke with PT. Patient unable to walk to the bathroom due to fear of falling and weakness. Daughter updated. She will speak with her dad and come up with their decision for a discharge plan.

## 2015-11-30 NOTE — Clinical Social Work Placement (Signed)
   CLINICAL SOCIAL WORK PLACEMENT  NOTE  Date:  11/30/2015  Patient Details  Name: [name] MRN: [id number] Date of Birth: Jun 04, 1920  Clinical Social Work is seeking post-discharge placement for this patient at the [place] level of care (*CSW will initial, date and re-position this form in  chart as items are completed):  Yes   Patient/family provided with [place] Work Department's list of facilities offering this level of care within the geographic area requested by the patient (or if unable, by the patient's family).  Yes   Patient/family informed of their freedom to choose among providers that offer the needed level of care, that participate in Medicare, Medicaid or managed care program needed by the patient, have an available bed and are willing to accept the patient.  Yes   Patient/family informed of [place]'s ownership interest in [facility] and [facility], as well as of the fact that they are under no obligation to receive care at these facilities.  PASRR submitted to EDS on       PASRR number received on       Existing PASRR number confirmed on 11/29/15     FL2 transmitted to all facilities in geographic area requested by pt/family on 11/29/15     FL2 transmitted to all facilities within larger geographic area on       Patient informed that his/her managed care company has contracts with or will negotiate with certain facilities, including the following:        Yes   Patient/family informed of bed offers received.  Patient chooses bed at  [facility] )     Physician recommends and patient chooses bed at      Patient to be transferred to  [facility] ) on 11/30/15.  Patient to be transferred to facility by  [facility] )     Patient family notified on 11/30/15 of transfer.  Name of family member notified:   (Patinet's daughter [name] is aware of D/C today. )     PHYSICIAN        Additional Comment:    _______________________________________________ Loralyn Freshwater, LCSW 11/30/2015, 3:26 PM

## 2015-11-30 NOTE — Discharge Instructions (Signed)
°  DIET:  Cardiac diet  DISCHARGE CONDITION:  Stable  ACTIVITY:  Activity as tolerated  OXYGEN:  Home Oxygen: No.   Oxygen Delivery: room air  DISCHARGE LOCATION:  nursing home    ADDITIONAL DISCHARGE INSTRUCTION:   If you experience worsening of your admission symptoms, develop shortness of breath, life threatening emergency, suicidal or homicidal thoughts you must seek medical attention immediately by calling 911 or calling your MD immediately  if symptoms less severe.  You Must read complete instructions/literature along with all the possible adverse reactions/side effects for all the Medicines you take and that have been prescribed to you. Take any new Medicines after you have completely understood and accpet all the possible adverse reactions/side effects.   Please note  You were cared for by a hospitalist during your hospital stay. If you have any questions about your discharge medications or the care you received while you were in the hospital after you are discharged, you can call the unit and asked to speak with the hospitalist on call if the hospitalist that took care of you is not available. Once you are discharged, your primary care physician will handle any further medical issues. Please note that NO REFILLS for any discharge medications will be authorized once you are discharged, as it is imperative that you return to your primary care physician (or establish a relationship with a primary care physician if you do not have one) for your aftercare needs so that they can reassess your need for medications and monitor your lab values.

## 2015-11-30 NOTE — Progress Notes (Signed)
Patient discharge to [facility]. Packet prepared by CSW, [employer]. Report called to Mateo Flow RN at facility. EMS called for transport. Daughter updated by CSW. IV and tele to be removed when EMS arrives.

## 2015-11-30 NOTE — Discharge Summary (Signed)
[name], 82 y.o., DOB 1920-05-15, MRN [id number]. Admission date: 11/26/2015 Discharge Date 11/30/2015 Primary MD [name], MD Admitting Physician Lance Coon, MD  Admission Diagnosis  Atrial fibrillation with RVR [facility]) [I48.91] Closed fracture of pelvis, unspecified part of pelvis, initial encounter ([place]) [S32.9XXA]  Discharge Diagnosis   Principal Problem:   Pelvic fracture (HCC)  Hyponatremia Urinary tract infection Hypokalemia Atrial fibrillation with rapid ventricular rate   HLD (hyperlipidemia)   HTN (hypertension)   GERD (gastroesophageal reflux disease)   CKD (chronic kidney disease), stage III           Hospital CourseDapathana Bahri is a 82 y.o. female who presents with Hip pain. Patient states that she fell about 2 weeks ago, but over the last 24 hours so has had increasing hip pain to the point that she is no longer able to bear weight without severe pain. Patient was seen in the ED she was found to have a pelvic fracture. She is also in A. fib with RVR. She is unable to ambulate. Due to the symptoms patient was admitted to the hospital for further evaluation and treatment. Patient's medication for atrial fibrillation were adjusted. With her heart rate stable. For her pelvic fracture she was seen by orthopedics who recommended weightbearing as tolerated. Patient is very weak and deconditioned and in need of rehabilitation. Which is currently being arranged.            Consults  orthopedic surgery  Significant Tests:  See full reports for all details      Dg Hip Unilat With Pelvis 1v Right  11/26/2015  CLINICAL DATA:  Patient to ER for right sided hip pain. Denies fall today, but did have fall two weeks ago. Has h/o hip fracture on same side with hip replacement. Patient appears rather uncomfortable. Patient has been ambulatory since fall 2 weeks ago. Patient walks earlier this morning but was unable to walk this evening. EXAM: DG HIP (WITH OR  WITHOUT PELVIS) 1V RIGHT COMPARISON:  CT, 09/10/2014 FINDINGS: There is a nondisplaced fracture of the inferior right pubic ramus. No other convincing fracture. Right hip pain arthroplasty is well-seated and aligned. SI joints, left hip and symphysis pubis are normally aligned. There is sclerosis on the iliac side of the SI joints, right greater than left, stable. Bones are demineralized. There are numerous pelvic vascular clips from previous surgery. IMPRESSION: 1. Nondisplaced fracture of the inferior right pubic ramus. 2. No other fractures.  No dislocation. Electronically Signed   By: Lajean Manes M.D.   On: 11/26/2015 20:31       Today   Subjective:   [name] feels better denies any complatins  Objective:   Blood pressure 131/68, pulse 78, temperature 97.8 F (36.6 C), temperature source Oral, resp. rate 16, height 5\' 2"  (1.575 m), weight 57.244 kg (126 lb 3.2 oz), SpO2 95 %.  .  Intake/Output Summary (Last 24 hours) at 11/30/15 0925 Last data filed at 11/29/15 1407  Gross per 24 hour  Intake      0 ml  Output      0 ml  Net      0 ml    Exam VITAL SIGNS: Blood pressure 131/68, pulse 78, temperature 97.8 F (36.6 C), temperature source Oral, resp. rate 16, height 5\' 2"  (1.575 m), weight 57.244 kg (126 lb 3.2 oz), SpO2 95 %.  GENERAL:  82 y.o.-year-old patient lying in the bed with no acute distress.  EYES: Pupils equal, round, reactive to light  and accommodation. No scleral icterus. Extraocular muscles intact.  HEENT: Head atraumatic, normocephalic. Oropharynx and nasopharynx clear.  NECK:  Supple, no jugular venous distention. No thyroid enlargement, no tenderness.  LUNGS: Normal breath sounds bilaterally, no wheezing, rales,rhonchi or crepitation. No use of accessory muscles of respiration.  CARDIOVASCULAR: S1, S2 normal. No murmurs, rubs, or gallops.  ABDOMEN: Soft, nontender, nondistended. Bowel sounds present. No organomegaly or mass.  EXTREMITIES: No pedal  edema, cyanosis, or clubbing.  NEUROLOGIC: Cranial nerves II through XII are intact. Muscle strength 5/5 in all extremities. Sensation intact. Gait not checked.  PSYCHIATRIC: The patient is alert and oriented x 3.  SKIN: No obvious rash, lesion, or ulcer.   Data Review     CBC w Diff: Lab Results  Component Value Date   WBC 11.5* 11/29/2015   WBC 9.1 12/16/2014   HGB 10.8* 11/29/2015   HGB 12.4 12/16/2014   HCT 32.7* 11/29/2015   HCT 36.4 12/16/2014   PLT 294 11/29/2015   PLT 240 12/16/2014   LYMPHOPCT 4 11/26/2015   LYMPHOPCT 20.0 12/16/2014   MONOPCT 5 11/26/2015   MONOPCT 8.5 12/16/2014   EOSPCT 0 11/26/2015   EOSPCT 1.4 12/16/2014   BASOPCT 1 11/26/2015   BASOPCT 0.9 12/16/2014   CMP: Lab Results  Component Value Date   NA 133* 11/29/2015   NA 139 09/10/2014   K 3.3* 11/29/2015   K 3.5 09/10/2014   CL 103 11/29/2015   CL 102 09/10/2014   CO2 25 11/29/2015   CO2 27 09/10/2014   BUN 15 11/29/2015   BUN 17 09/10/2014   CREATININE 0.81 11/29/2015   CREATININE 1.17 09/10/2014   PROT 7.1 09/08/2015   PROT 7.3 09/10/2014   ALBUMIN 3.1* 09/08/2015   ALBUMIN 3.5 09/10/2014   BILITOT 0.6 09/08/2015   BILITOT 0.9 09/10/2014   ALKPHOS 97 09/08/2015   ALKPHOS 132* 09/10/2014   AST 23 09/08/2015   AST 13* 09/10/2014   ALT 13* 09/08/2015   ALT 14 09/10/2014  .  Micro Results Recent Results (from the past 240 hour(s))  Urine culture     Status: None   Collection Time: 11/26/15 10:09 PM  Result Value Ref Range Status   Specimen Description URINE, RANDOM  Final   Special Requests NONE  Final   Culture MULTIPLE SPECIES PRESENT, SUGGEST RECOLLECTION  Final   Report Status 11/29/2015 FINAL  Final        Code Status Orders        Start     Ordered   11/27/15 0105  Full code   Continuous     11/27/15 0104    Code Status History    Date Active Date Inactive Code Status Order ID Comments User Context   09/08/2015 11:02 PM 09/11/2015  5:55 PM Full Code  [id number]  Lance Coon, MD Inpatient    Advance Directive Documentation        Most Recent Value   Type of Advance Directive  Healthcare Power of Attorney   Pre-existing out of facility DNR order (yellow form or pink MOST form)     "MOST" Form in Place?            Follow-up Information    Follow up with [name], MD In 7 days.   Specialty:  Family Medicine   Contact information:   [place] [id number] [phone]       Discharge Medications     Medication List    TAKE these  medications        acetaminophen 500 MG tablet  Commonly known as:  TYLENOL  Take 500 mg by mouth every 6 (six) hours as needed for mild pain.     acetaminophen 325 MG tablet  Commonly known as:  TYLENOL  Take 2 tablets (650 mg total) by mouth every 6 (six) hours as needed for mild pain (or Fever >/= 101).     apixaban 2.5 MG Tabs tablet  Commonly known as:  ELIQUIS  Take 1 tablet (2.5 mg total) by mouth 2 (two) times daily.     cholecalciferol 1000 units tablet  Commonly known as:  VITAMIN D  Take 1,000 Units by mouth daily.     diclofenac sodium 1 % Gel  Commonly known as:  VOLTAREN  Apply 2 g topically 4 (four) times daily as needed (for left shoulder pain).     Digoxin 62.5 MCG Tabs  Take 0.0625 mg by mouth daily.     diltiazem 180 MG 24 hr capsule  Commonly known as:  DILACOR XR  Take 1 capsule (180 mg total) by mouth daily.     docusate sodium 100 MG capsule  Commonly known as:  COLACE  Take 100 mg by mouth 3 (three) times daily as needed for mild constipation.     feeding supplement (ENSURE ENLIVE) Liqd  Take 237 mLs by mouth 2 (two) times daily between meals.     ferrous sulfate 325 (65 FE) MG tablet  Take 325 mg by mouth daily with breakfast.     furosemide 20 MG tablet  Commonly known as:  LASIX  Take 20 mg by mouth daily as needed for edema.     HYDROcodone-acetaminophen 5-325 MG tablet  Commonly known as:  NORCO/VICODIN  Take 1 tablet  by mouth 3 (three) times daily as needed for moderate pain.     hydrocortisone 2.5 % cream  Apply 1 application topically 2 (two) times daily as needed (for irritation).     hydrocortisone 25 MG suppository  Commonly known as:  ANUSOL-HC  Place 25 mg rectally 2 (two) times daily as needed for hemorrhoids.     lidocaine 4 % cream  Commonly known as:  LMX  Apply 1 application topically as needed (for pain).     nystatin cream  Commonly known as:  MYCOSTATIN  Apply 1 application topically 2 (two) times daily as needed (for yeast).     omeprazole 20 MG capsule  Commonly known as:  PRILOSEC  Take 20 mg by mouth daily.     polyethylene glycol packet  Commonly known as:  MIRALAX / GLYCOLAX  Take 17 g by mouth daily.     potassium chloride 10 MEQ tablet  Commonly known as:  K-DUR  Take 10 mEq by mouth daily as needed (when taking Lasix).     pravastatin 20 MG tablet  Commonly known as:  PRAVACHOL  Take 20 mg by mouth at bedtime.     Vitamin B-12 6000 MCG Subl  Place 6,000 mcg under the tongue daily.           Total Time in preparing paper work, data evaluation and todays exam - 35 minutes  Dustin Flock M.D on 11/30/2015 at 9:25 AM  [place]  [phone]

## 2015-11-30 NOTE — Progress Notes (Signed)
Discharged to [facility]. Left with EMS on stretcher. IV and tele d/c'd. Left with belongings. Daughter at side.

## 2015-11-30 NOTE — Progress Notes (Signed)
[id number] in room .Talking in phone. No acute distress noted

## 2015-11-30 NOTE — Progress Notes (Signed)
Physical Therapy Treatment Patient Details Name: [name] MRN: [id number] DOB: 20-Sep-1919 Today's Date: 11/30/2015    History of Present Illness [name] is a 82 y.o. female who presents with R nondisplaced pelvic fracture, WBAT. in A-fib, hypokalemia, UTI, recent fall with pt being unable to talk about it. PMHx:  frontal lobe infarct, colostomy, SBO, chronic renal insufficiency,     PT Comments    Pt was mod assist for bed mobility and 2 sets of sit to stand with RW.  Pt sat EOB for 8 minutes.  Pt displayed increased trunk flexion during standing and could not stand fully erect.  Pt appeared to display increased anxiety during mobility and decreased mobility compared to last session.  Pt reported that the reason she is having difficulty with mobility is because of weakness and fear of falling.  Next session PT will attempt ambulation.     Follow Up Recommendations  SNF     Equipment Recommendations  Rolling walker with 5" wheels    Recommendations for Other Services       Precautions / Restrictions Precautions Precautions: Fall Restrictions Weight Bearing Restrictions: Yes Other Position/Activity Restrictions: WBAT     Mobility  Bed Mobility Overal bed mobility: Needs Assistance Bed Mobility: Sit to Sidelying;Rolling;Sidelying to Sit Rolling: Mod assist Sidelying to sit: Mod assist     Sit to sidelying: Mod assist General bed mobility comments: increased time, assist with trunk and LE   Transfers Overall transfer level: Needs assistance Equipment used: Rolling walker (2 wheeled) Transfers: Sit to/from Stand Sit to Stand: Mod assist (2 sets )            Ambulation/Gait             General Gait Details: was not performed due to fatigue    Stairs            Wheelchair Mobility    Modified Rankin (Stroke Patients Only)       Balance Overall balance assessment: Needs assistance Sitting-balance support: Feet supported;Single  extremity supported (Bed rail ) Sitting balance-Leahy Scale: Fair     Standing balance support: Bilateral upper extremity supported (RW) Standing balance-Leahy Scale: Poor                      Cognition Arousal/Alertness: Awake/alert Behavior During Therapy: Anxious Overall Cognitive Status: No family/caregiver present to determine baseline cognitive functioning       Memory: Decreased short-term memory              Exercises      General Comments   Nursing was contacted and cleared pt for physical therapy.  Pt was agreeable and session was modified due to fatigue and anxiety.       Pertinent Vitals/Pain Faces Pain Scale: Hurts even more Pain Location: L shoulder R hip  Pain Descriptors / Indicators: Aching;Crying;Grimacing Pain Intervention(s): Limited activity within patient's tolerance;Monitored during session;Repositioned  See flow sheet for vitals.     Home Living                      Prior Function            PT Goals (current goals can now be found in the care plan section) Acute Rehab PT Goals Patient Stated Goal: to go home  PT Goal Formulation: With patient Time For Goal Achievement: 12/13/15 Potential to Achieve Goals: Fair Progress towards PT goals: Progressing toward goals    Frequency  7X/week  PT Plan      Co-evaluation             End of Session Equipment Utilized During Treatment: Gait belt Activity Tolerance: Patient limited by fatigue Patient left: in bed;with call bell/phone within reach;with bed alarm set     Time: [id number] PT Time Calculation (min) (ACUTE ONLY): 31 min  Charges:                       G Codes:       Mittie Bodo, SPT  Mittie Bodo 11/30/2015, 11:35 AM

## 2015-11-30 NOTE — Progress Notes (Signed)
Report from Myrna RN. Patient resting quietly, no distress. Will continue to monitor.  

## 2015-12-02 DIAGNOSIS — D649 Anemia, unspecified: Secondary | ICD-10-CM | POA: Diagnosis present

## 2015-12-02 DIAGNOSIS — E876 Hypokalemia: Secondary | ICD-10-CM | POA: Diagnosis not present

## 2015-12-02 DIAGNOSIS — E871 Hypo-osmolality and hyponatremia: Secondary | ICD-10-CM | POA: Diagnosis present

## 2015-12-02 LAB — COMPREHENSIVE METABOLIC PANEL
ALBUMIN: 2.5 g/dL — AB (ref 3.5–5.0)
ALK PHOS: 113 U/L (ref 38–126)
ALT: 23 U/L (ref 14–54)
ANION GAP: 8 (ref 5–15)
AST: 24 U/L (ref 15–41)
BUN: 21 mg/dL — ABNORMAL HIGH (ref 6–20)
CHLORIDE: 97 mmol/L — AB (ref 101–111)
CO2: 28 mmol/L (ref 22–32)
Calcium: 8.8 mg/dL — ABNORMAL LOW (ref 8.9–10.3)
Creatinine, Ser: 0.8 mg/dL (ref 0.44–1.00)
GFR calc Af Amer: >60 mL/min (ref >60)
GFR calc non Af Amer: >60 mL/min (ref >60)
Glucose, Bld: 109 mg/dL — ABNORMAL HIGH (ref 65–99)
POTASSIUM: 3.6 mmol/L (ref 3.5–5.1)
Sodium: 133 mmol/L — ABNORMAL LOW (ref 135–145)
Total Bilirubin: 0.7 mg/dL (ref 0.3–1.2)
Total Protein: 6.1 g/dL — ABNORMAL LOW (ref 6.5–8.1)

## 2015-12-02 LAB — CBC WITH DIFFERENTIAL/PLATELET
BASOS PCT: 1 %
Basophils Absolute: 0.1 10*3/uL (ref 0–0.1)
Eosinophils Absolute: 0.2 10*3/uL (ref 0–0.7)
Eosinophils Relative: 2 %
HCT: 35.2 % (ref 35.0–47.0)
HEMOGLOBIN: 11.7 g/dL — AB (ref 12.0–16.0)
LYMPHS ABS: 1 10*3/uL (ref 1.0–3.6)
LYMPHS PCT: 9 %
MCH: 29.1 pg (ref 26.0–34.0)
MCHC: 33.2 g/dL (ref 32.0–36.0)
MCV: 87.8 fL (ref 80.0–100.0)
MONOS PCT: 6 %
Monocytes Absolute: 0.7 10*3/uL (ref 0.2–0.9)
NEUTROS ABS: 9.8 10*3/uL — AB (ref 1.4–6.5)
NEUTROS PCT: 82 %
PLATELETS: 362 10*3/uL (ref 150–440)
RBC: 4.01 MIL/uL (ref 3.80–5.20)
RDW: 16.1 % — ABNORMAL HIGH (ref 11.5–14.5)
WBC: 11.9 10*3/uL — ABNORMAL HIGH (ref 3.6–11.0)

## 2015-12-02 LAB — TSH: TSH: 2.495 u[IU]/mL (ref 0.350–4.500)

## 2015-12-02 LAB — VITAMIN B12: VITAMIN B 12: 2706 pg/mL — AB (ref 180–914)

## 2015-12-02 NOTE — Progress Notes (Signed)
[place] [facility]                                                                                                                                                                                            Patient Demographics   [name], is a 82 y.o. female, DOB - 06/03/1920, [id number]  Admit date - 11/20/2015   Admitting Physician Kirk Ruths, MD  Outpatient Primary MD for the patient is [name], MD   LOS -   Subjective: not moving around too much still having pain no cp or sob  Review of Systems:   CONSTITUTIONAL: No documented fever. Generalized fatigue and weakness. No weight gain, no weight loss.  EYES: No blurry or double vision.  ENT: No tinnitus. No postnasal drip. No redness of the oropharynx.  RESPIRATORY: No cough, no wheeze, no hemoptysis. No dyspnea.  CARDIOVASCULAR: Positive chest pain. No orthopnea. No palpitations. No syncope.  GASTROINTESTINAL: No nausea, no vomiting or diarrhea. No abdominal pain. No melena or hematochezia.  GENITOURINARY: No dysuria or hematuria.  ENDOCRINE: No polyuria or nocturia. No heat or cold intolerance.  HEMATOLOGY: No anemia. No bruising. No bleeding.  INTEGUMENTARY: No rashes. No lesions.  MUSCULOSKELETAL: Diffuse joint pains. No swelling. No gout.  NEUROLOGIC: No numbness, tingling, or ataxia. No seizure-type activity.  PSYCHIATRIC: No anxiety. No insomnia. No ADD.    Vitals:   There were no vitals filed for this visit.  Wt Readings from Last 3 Encounters:  11/27/15 57.244 kg (126 lb 3.2 oz)  09/08/15 58.968 kg (130 lb)  04/19/15 59.699 kg (131 lb 9.8 oz)    No intake or output data in the 24 hours ending 12/02/15 1019  Physical Exam:   GENERAL: Pleasant-appearing in no apparent distress.  HEAD, EYES, EARS, NOSE AND THROAT: Atraumatic, normocephalic. Extraocular muscles are intact. Pupils equal and reactive to light. Sclerae anicteric. No conjunctival  injection. No oro-pharyngeal erythema.  NECK: Supple. There is no jugular venous distention. No bruits, no lymphadenopathy, no thyromegaly.  HEART: Irregularly irregular heart rhythm. No murmurs, no rubs, no clicks.  LUNGS: Clear to auscultation bilaterally. No rales or rhonchi. No wheezes.  ABDOMEN: Soft, flat, nontender, nondistended. Has good bowel sounds. No hepatosplenomegaly appreciated.  EXTREMITIES: No evidence of any cyanosis, clubbing, or peripheral edema.  +2 pedal and radial pulses bilaterally.  NEUROLOGIC: The patient is alert, awake, and oriented x3 with no focal motor or sensory deficits appreciated bilaterally.  SKIN: Moist and warm with no rashes appreciated.  Psych: Not anxious, depressed LN: No inguinal LN enlargement    Antibiotics   Anti-infectives  None      Medications   Scheduled Meds:  Continuous Infusions:  PRN Meds:.   Data Review:   Micro Results Recent Results (from the past 240 hour(s))  Urine culture     Status: None   Collection Time: 11/26/15 10:09 PM  Result Value Ref Range Status   Specimen Description URINE, RANDOM  Final   Special Requests NONE  Final   Culture MULTIPLE SPECIES PRESENT, SUGGEST RECOLLECTION  Final   Report Status 11/29/2015 FINAL  Final    Radiology Reports Dg Hip Unilat With Pelvis 1v Right  11/26/2015  CLINICAL DATA:  Patient to ER for right sided hip pain. Denies fall today, but did have fall two weeks ago. Has h/o hip fracture on same side with hip replacement. Patient appears rather uncomfortable. Patient has been ambulatory since fall 2 weeks ago. Patient walks earlier this morning but was unable to walk this evening. EXAM: DG HIP (WITH OR WITHOUT PELVIS) 1V RIGHT COMPARISON:  CT, 09/10/2014 FINDINGS: There is a nondisplaced fracture of the inferior right pubic ramus. No other convincing fracture. Right hip pain arthroplasty is well-seated and aligned. SI joints, left hip and symphysis pubis are normally aligned.  There is sclerosis on the iliac side of the SI joints, right greater than left, stable. Bones are demineralized. There are numerous pelvic vascular clips from previous surgery. IMPRESSION: 1. Nondisplaced fracture of the inferior right pubic ramus. 2. No other fractures.  No dislocation. Electronically Signed   By: Lajean Manes M.D.   On: 11/26/2015 20:31     CBC  Recent Labs Lab 11/26/15 2209 11/27/15 0505 11/28/15 0440 11/29/15 0507 12/02/15 0729  WBC 19.9* 18.1* 12.6* 11.5* 11.9*  HGB 11.5* 11.8* 10.2* 10.8* 11.7*  HCT 34.6* 35.6 31.1* 32.7* 35.2  PLT 302 269 284 294 362  MCV 88.2 89.0 88.6 89.7 87.8  MCH 29.3 29.4 29.2 29.7 29.1  MCHC 33.2 33.1 32.9 33.1 33.2  RDW 16.3* 16.2* 16.1* 16.0* 16.1*  LYMPHSABS 0.8*  --   --   --  1.0  MONOABS 1.0*  --   --   --  0.7  EOSABS 0.1  --   --   --  0.2  BASOSABS 0.2*  --   --   --  0.1    Chemistries   Recent Labs Lab 11/26/15 2209 11/27/15 0505 11/28/15 0440 11/29/15 0507  NA 123* 125* 130* 133*  K 3.2* 3.3* 3.5 3.3*  CL 91* 95* 101 103  CO2 23 19* 23 25  GLUCOSE 130* 136* 109* 111*  BUN 25* 21* 20 15  CREATININE 0.92 0.82 0.91 0.81  CALCIUM 8.0* 7.7* 7.9* 8.0*   ------------------------------------------------------------------------------------------------------------------ estimated creatinine clearance is 34.3 mL/min (by C-G formula based on Cr of 0.81). ------------------------------------------------------------------------------------------------------------------ No results for input(s): HGBA1C in the last 72 hours. ------------------------------------------------------------------------------------------------------------------ No results for input(s): CHOL, HDL, LDLCALC, TRIG, CHOLHDL, LDLDIRECT in the last 72 hours. ------------------------------------------------------------------------------------------------------------------ No results for input(s): TSH, T4TOTAL, T3FREE, THYROIDAB in the last 72  hours.  Invalid input(s): FREET3 ------------------------------------------------------------------------------------------------------------------ No results for input(s): VITAMINB12, FOLATE, FERRITIN, TIBC, IRON, RETICCTPCT in the last 72 hours.  Coagulation profile  Recent Labs Lab 11/26/15 2209  INR 1.43    No results for input(s): DDIMER in the last 72 hours.  Cardiac Enzymes  Recent Labs Lab 11/26/15 2209  TROPONINI <0.03   ------------------------------------------------------------------------------------------------------------------ Invalid input(s): POCBNP    Assessment & Plan  Patient is a 82 year old white female with fall  1. Hyponatremia suspect due to  dehydration: Improving , continue to monitor due to poor po intake  2. Urinary tract infection:  Urine cx with no growth  Change to po antibiotics  3. Hypokalemia resolved past replacement   4. Atrial fibrillation with rapid ventricular rate heart rate is improved  Hr improved with change in cardizem  Continue Eliquis as doing at home  5. Pelvic fracture: Seen by orthopedics, physical therapy consult pain control  6. Hyperlipidemia stable continue pravastatin   7. Miscellaneous she is already on Eliquis which should be good for DVT prophylaxis.  dispo to snf    I will give her oral potassium replacement    Code Status Orders        Start     Ordered   11/27/15 0105  Full code   Continuous     11/27/15 0104    Code Status History    Date Active Date Inactive Code Status Order ID Comments User Context   09/08/2015 11:02 PM 09/11/2015  5:55 PM Full Code [id number]  Lance Coon, MD Inpatient    Advance Directive Documentation        Most Recent Value   Type of Advance Directive  Healthcare Power of Attorney   Pre-existing out of facility DNR order (yellow form or pink MOST form)     "MOST" Form in Place?             Consults physical therapy   DVT Prophylaxis  Eliquis  Lab  Results  Component Value Date   PLT 362 12/02/2015     Time Spent in minutes   30min  Elmo Shumard M.D on 12/02/2015 at 10:19 AM  Between 7am to 6pm - Pager - [phone]  After 6pm go to [email or web address] - password [email or web address] [facility]   Office  [phone]

## 2015-12-16 DIAGNOSIS — D649 Anemia, unspecified: Secondary | ICD-10-CM | POA: Diagnosis not present

## 2015-12-16 LAB — CBC WITH DIFFERENTIAL/PLATELET
Basophils Absolute: 0.1 10*3/uL (ref 0–0.1)
Basophils Relative: 0 %
EOS ABS: 0.1 10*3/uL (ref 0–0.7)
EOS PCT: 1 %
HCT: 34.2 % — ABNORMAL LOW (ref 35.0–47.0)
Hemoglobin: 11.6 g/dL — ABNORMAL LOW (ref 12.0–16.0)
LYMPHS ABS: 1.1 10*3/uL (ref 1.0–3.6)
Lymphocytes Relative: 8 %
MCH: 29.3 pg (ref 26.0–34.0)
MCHC: 33.8 g/dL (ref 32.0–36.0)
MCV: 86.5 fL (ref 80.0–100.0)
MONOS PCT: 7 %
Monocytes Absolute: 1 10*3/uL — ABNORMAL HIGH (ref 0.2–0.9)
Neutro Abs: 11.7 10*3/uL — ABNORMAL HIGH (ref 1.4–6.5)
Neutrophils Relative %: 84 %
PLATELETS: 338 10*3/uL (ref 150–440)
RBC: 3.95 MIL/uL (ref 3.80–5.20)
RDW: 16 % — ABNORMAL HIGH (ref 11.5–14.5)
WBC: 14 10*3/uL — ABNORMAL HIGH (ref 3.6–11.0)

## 2015-12-16 LAB — BASIC METABOLIC PANEL
Anion gap: 10 (ref 5–15)
BUN: 19 mg/dL (ref 6–20)
CHLORIDE: 100 mmol/L — AB (ref 101–111)
CO2: 26 mmol/L (ref 22–32)
CREATININE: 0.95 mg/dL (ref 0.44–1.00)
Calcium: 8.7 mg/dL — ABNORMAL LOW (ref 8.9–10.3)
GFR calc Af Amer: 58 mL/min — ABNORMAL LOW (ref >60)
GFR calc non Af Amer: 50 mL/min — ABNORMAL LOW (ref >60)
Glucose, Bld: 110 mg/dL — ABNORMAL HIGH (ref 65–99)
Potassium: 3.5 mmol/L (ref 3.5–5.1)
SODIUM: 136 mmol/L (ref 135–145)

## 2015-12-20 ENCOUNTER — Encounter
Admission: RE | Admit: 2015-12-20 | Discharge: 2015-12-20 | Disposition: A | Discharge status: Home / Self Care | Payer: Medicare Other | Source: Ambulatory Visit | Attending: Internal Medicine | Admitting: Internal Medicine

## 2016-02-02 IMAGING — US US CAROTID DUPLEX BILAT
1 series · 13 of 24 positions shown · non-contrast
Comparison: No recent prior .

CLINICAL DATA: Stroke.

EXAM:
BILATERAL CAROTID DUPLEX ULTRASOUND
TECHNIQUE: Gray scale imaging, color Doppler and duplex ultrasound were
performed of bilateral carotid and vertebral arteries in the neck.

[Series 1: us carotid duplex bilat · 0.06mm/px · 13 of 68 slices shown]
[im 1/68]
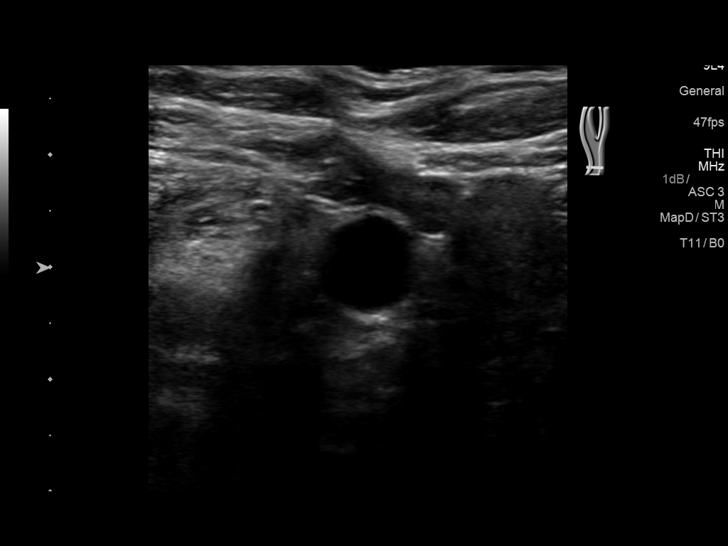
[im 6/68]
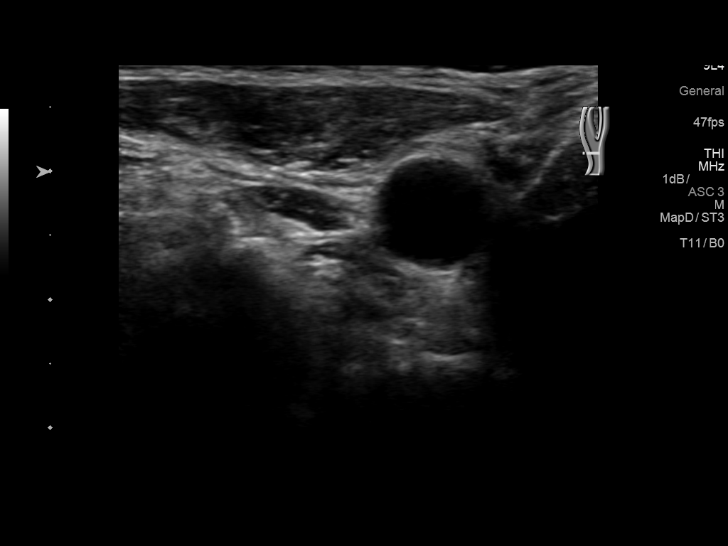
[im 12/68]
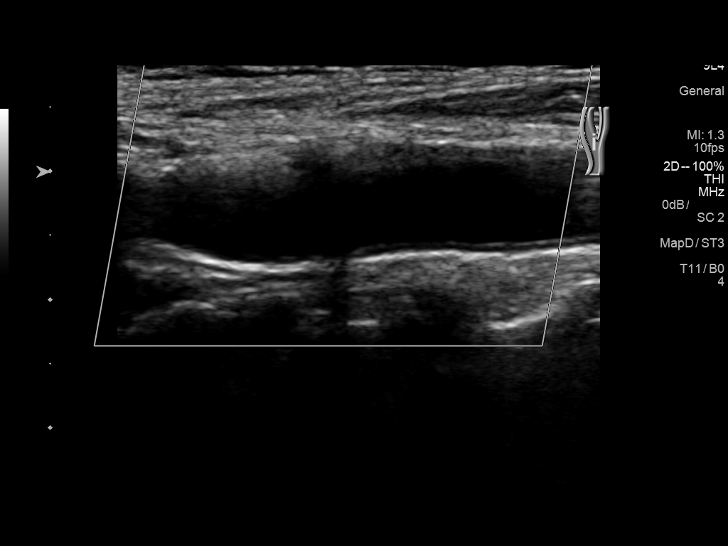
[im 18/68]
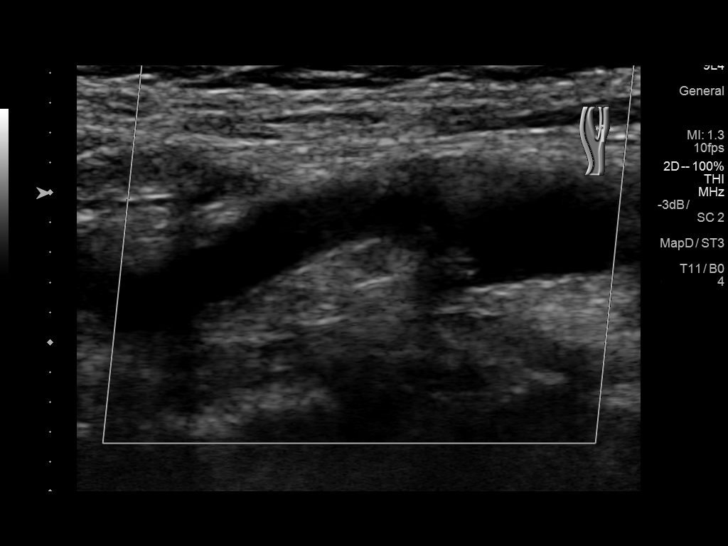
[im 24/68]
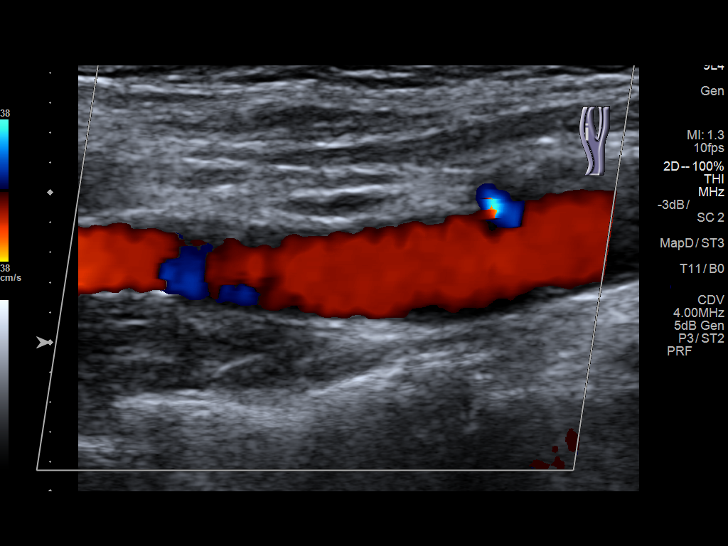
[im 30/68]
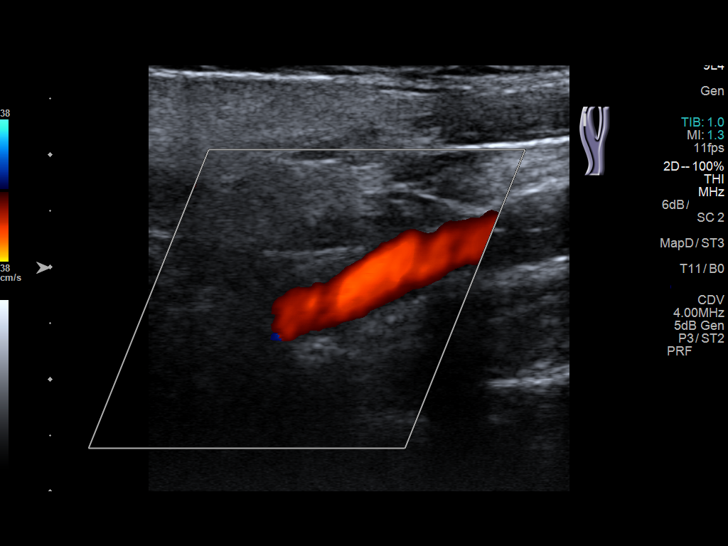
[im 35/68]
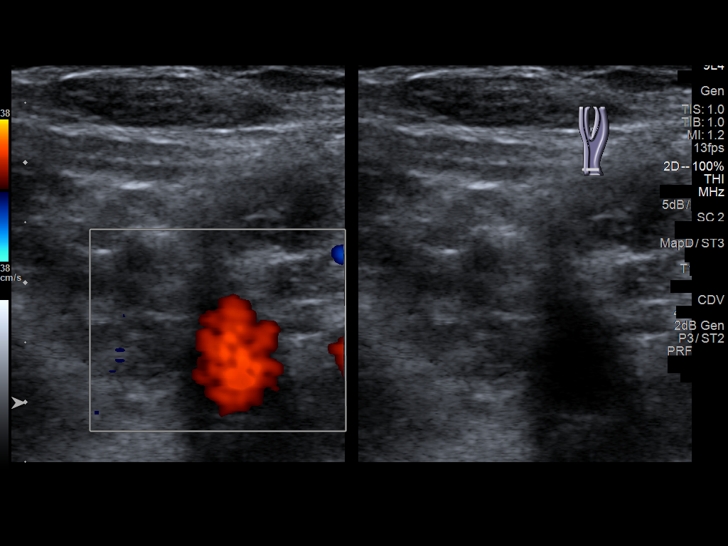
[im 38/68]
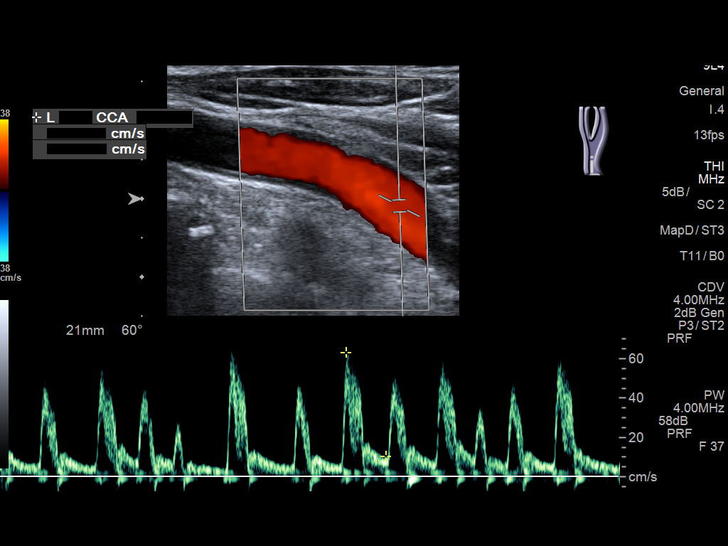
[im 44/68]
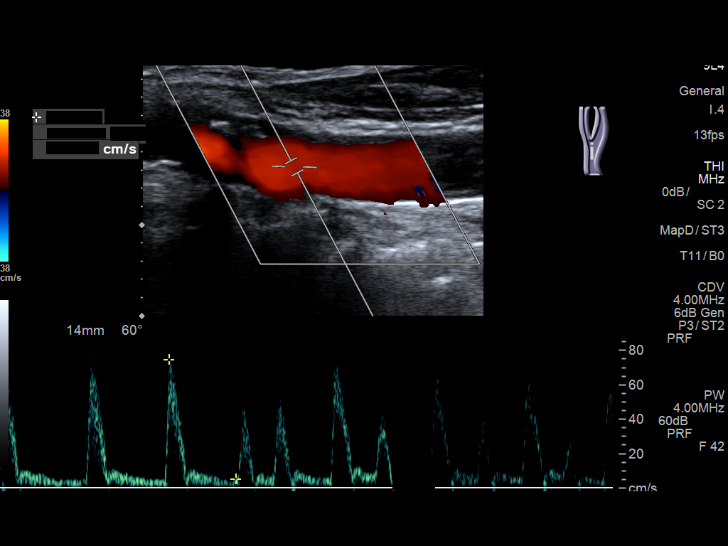
[im 50/68]
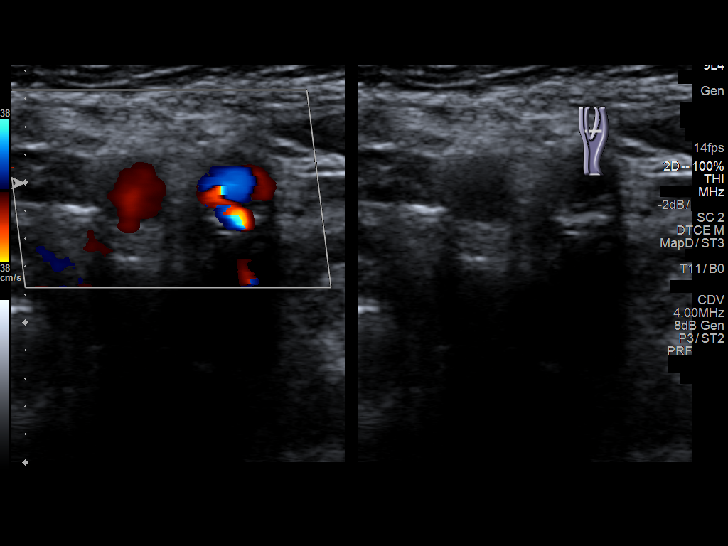
[im 56/68]
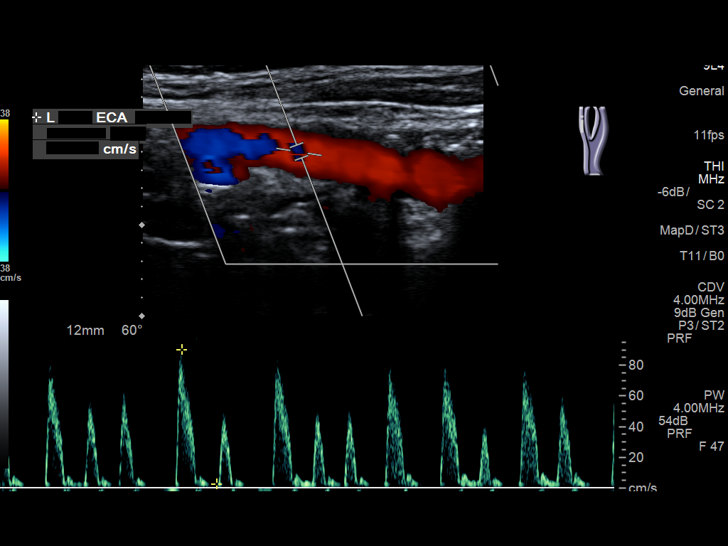
[im 62/68]
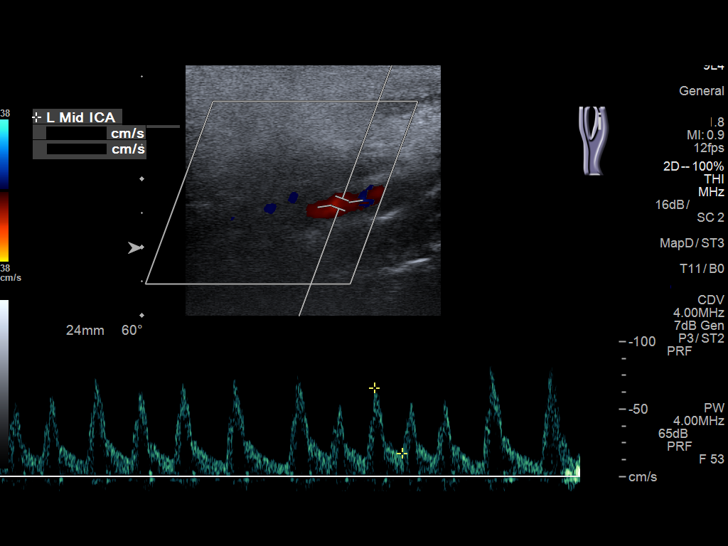
[im 68/68]
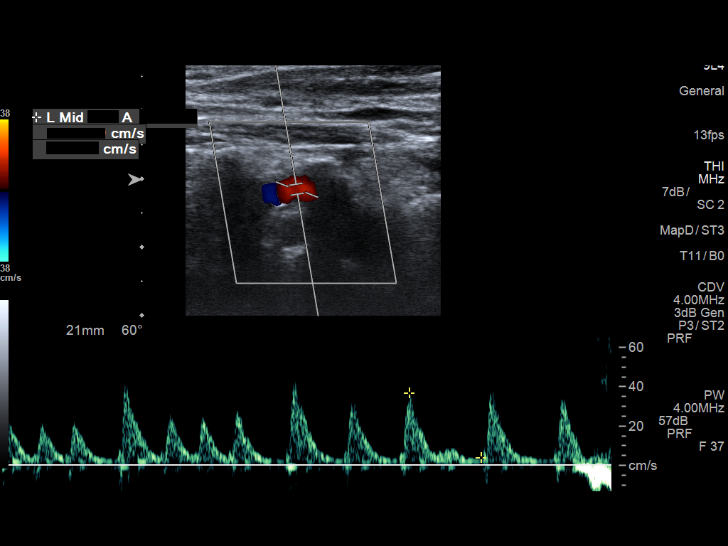

[13 of 24 positions shown; findings below may reference images not displayed]

FINDINGS: Criteria: Quantification of carotid stenosis is based on velocity
parameters that correlate the residual internal carotid diameter
with NASCET-based stenosis levels, using the diameter of the distal
internal carotid lumen as the denominator for stenosis measurement.

The following velocity measurements were obtained:

RIGHT

ICA:  81/26 cm/sec

CCA:  52/10 cm/sec

SYSTOLIC ICA/CCA RATIO:

DIASTOLIC ICA/CCA RATIO:

ECA:  108 cm/sec

LEFT

ICA:  81/27 cm/sec

CCA:  71/10 cm/sec

SYSTOLIC ICA/CCA RATIO:

DIASTOLIC ICA/CCA RATIO:

ECA:  90 cm/sec

RIGHT CAROTID ARTERY: Mild carotid bifurcation and ICAs plaque. No
flow limiting stenosis.

RIGHT VERTEBRAL ARTERY:  Patent with antegrade flow.

LEFT CAROTID ARTERY: Mild left distal common carotid carotid
bifurcation and proximal ICA plaque. No flow limiting stenosis.

LEFT VERTEBRAL ARTERY:  Patent antegrade flow.
IMPRESSION: 1. Mild bilateral carotid atherosclerotic vascular plaque. No flow
limiting stenosis. Degree of stenosis less than 50%.
2. Vertebral arteries are patent antegrade flow.

## 2016-04-03 NOTE — Progress Notes (Deleted)
[name]  [phone] [phone]  ID: Anders Grant OB: 03-04-1920  MR#: [id number]  [id number]  Patient Care Team: [name], MD as PCP - General (Family Medicine)  CHIEF COMPLAINT: Stage IIa adenocarcinoma of the rectum.  INTERVAL HISTORY: Patient return returns to clinic today for routine evaluation and laboratory work. She is having increased rectal pain, but states she is not taking any of her narcotics as prescribed. She only takes Tylenol because narcotics make her feel confused and dizzy. She has a decreased appetite.  She denies any recent fevers. She has no neurologic complaints.  She denies any chest pain or shortness of breath.  She denies any nausea, vomiting, or constipation.  She denies any melena or hematochezia. Patient offers no further specific complaints today.  REVIEW OF SYSTEMS:   Review of Systems  Constitutional: Positive for malaise/fatigue. Negative for fever.  Respiratory: Negative.   Cardiovascular: Negative.   Gastrointestinal: Negative for abdominal pain, blood in stool, constipation, diarrhea, melena and nausea.       Rectal pain  Genitourinary: Negative for dysuria.  Neurological: Positive for weakness.    As per HPI. Otherwise, a complete review of systems is negatve.  PAST MEDICAL HISTORY: Past Medical History:  Diagnosis Date  . A-fib ([place])   . Anemia   . Anemia   . Ankle edema   . 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 Chronic renal insufficiency   . Colovaginal fistula   . Diverticulosis   . Duodenitis   . Elevated serum creatinine   . Endometrial cancer ([place])   . GERD (gastroesophageal reflux disease)   . Hyperlipidemia   . Hypertension   . Lumbar spondylitis ([place])   . Lumbar spondylosis   . Malignant neoplasm of rectum ([place])   . Small bowel obstruction ([place])   . Vitamin B12 deficiency     PAST SURGICAL HISTORY: Past Surgical History:  Procedure Laterality Date  . ABDOMINAL HYSTERECTOMY     . APPENDECTOMY      FAMILY HISTORY Family History  Problem Relation Age of Onset  . Diabetes type II Mother   . Coronary artery disease Father   . Tuberculosis Father   . Diabetes type II Brother   . Liver cancer Son   . Colon cancer Son   . Coronary artery disease Brother   . Breast cancer Daughter        ADVANCED DIRECTIVES:    HEALTH MAINTENANCE: Social History  Substance Use Topics  . Smoking status: Never Smoker  . Smokeless tobacco: Never Used  . Alcohol use No    Allergies  Allergen Reactions  . Sulfa Antibiotics Rash  . Meperidine Nausea And Vomiting    Current Outpatient Prescriptions  Medication Sig Dispense Refill  . acetaminophen (TYLENOL) 325 MG tablet Take 2 tablets (650 mg total) by mouth every 6 (six) hours as needed for mild pain (or Fever >/= 101). 30 tablet 00  . acetaminophen (TYLENOL) 500 MG tablet Take 500 mg by mouth every 6 (six) hours as needed for mild pain.    Marland Kitchen apixaban (ELIQUIS) 2.5 MG TABS tablet Take 1 tablet (2.5 mg total) by mouth 2 (two) times daily. 60 tablet 0  . cholecalciferol (VITAMIN D) 1000 units tablet Take 1,000 Units by mouth daily.    . Cyanocobalamin (VITAMIN B-12) 6000 MCG SUBL Place 6,000 mcg under the tongue daily.    . diclofenac sodium (VOLTAREN) 1 % GEL Apply 2 g topically 4 (four) times daily as needed (  for left shoulder pain).    Marland Kitchen digoxin 62.5 MCG TABS Take 0.0625 mg by mouth daily. 30 tablet 0  . diltiazem (DILACOR XR) 180 MG 24 hr capsule Take 1 capsule (180 mg total) by mouth daily. 3 capsule   . docusate sodium (COLACE) 100 MG capsule Take 100 mg by mouth 3 (three) times daily as needed for mild constipation.    . feeding supplement, ENSURE ENLIVE, (ENSURE ENLIVE) LIQD Take 237 mLs by mouth 2 (two) times daily between meals. 237 mL 12  . ferrous sulfate 325 (65 FE) MG tablet Take 325 mg by mouth daily with breakfast.     . furosemide (LASIX) 20 MG tablet Take 20 mg by mouth daily as needed for edema.     Marland Kitchen  HYDROcodone-acetaminophen (NORCO/VICODIN) 5-325 MG tablet Take 1 tablet by mouth 3 (three) times daily as needed for moderate pain. 30 tablet 0  . hydrocortisone (ANUSOL-HC) 25 MG suppository Place 25 mg rectally 2 (two) times daily as needed for hemorrhoids.     . hydrocortisone 2.5 % cream Apply 1 application topically 2 (two) times daily as needed (for irritation).    Marland Kitchen lidocaine (LMX) 4 % cream Apply 1 application topically as needed (for pain).    . nystatin cream (MYCOSTATIN) Apply 1 application topically 2 (two) times daily as needed (for yeast).     Marland Kitchen omeprazole (PRILOSEC) 20 MG capsule Take 20 mg by mouth daily.     . polyethylene glycol (MIRALAX / GLYCOLAX) packet Take 17 g by mouth daily.    . potassium chloride (K-DUR) 10 MEQ tablet Take 10 mEq by mouth daily as needed (when taking Lasix).     . pravastatin (PRAVACHOL) 20 MG tablet Take 20 mg by mouth at bedtime.     No current facility-administered medications for this visit.     OBJECTIVE: There were no vitals filed for this visit.   There is no height or weight on file to calculate BMI.    ECOG FS:2 - Symptomatic, <50% confined to bed  General: Well-developed, well-nourished, no acute distress. Eyes: Pink conjunctiva, anicteric sclera. Lungs: Clear to auscultation bilaterally. Heart: Regular rate and rhythm. No rubs, murmurs, or gallops. Abdomen: Soft, nontender, nondistended. No organomegaly noted, normoactive bowel sounds. Colostomy in left lower quadrant. Musculoskeletal: No edema, cyanosis, or clubbing. Neuro: Alert, answering all questions appropriately. Cranial nerves grossly intact. Skin: No rashes or petechiae noted. Psych: Normal affect.   LAB RESULTS:  Lab Results  Component Value Date   NA 136 12/16/2015   K 3.5 12/16/2015   CL 100 (L) 12/16/2015   CO2 26 12/16/2015   GLUCOSE 110 (H) 12/16/2015   BUN 19 12/16/2015   CREATININE 0.95 12/16/2015   CALCIUM 8.7 (L) 12/16/2015   PROT 6.1 (L) 12/02/2015    ALBUMIN 2.5 (L) 12/02/2015   AST 24 12/02/2015   ALT 23 12/02/2015   ALKPHOS 113 12/02/2015   BILITOT 0.7 12/02/2015   GFRNONAA 50 (L) 12/16/2015   GFRAA 58 (L) 12/16/2015    Lab Results  Component Value Date   WBC 14.0 (H) 12/16/2015   NEUTROABS 11.7 (H) 12/16/2015   HGB 11.6 (L) 12/16/2015   HCT 34.2 (L) 12/16/2015   MCV 86.5 12/16/2015   PLT 338 12/16/2015    Lab Results  Component Value Date   CEA 34.3 (H) 11/23/2015    STUDIES: No results found.  ASSESSMENT: Stage IIa adenocarcinoma of the rectum.  PLAN:   1.  Rectal cancer: Previously, CT scan  results noted with possible residual disease. Given patient's age and performance status, it was determined that no further chemotherapy would be used. CEA is slowly increasing most recent result is 34.3.  Since additional treatments are likely not optimal, no further imaging is necessary. Return to clinic in 4 months with repeat laboratory work and further evaluation.  2. Pain: Patient was encouraged to use her fentanyl patch as prescribed.  3. Decreased appetite: Patient encouraged to eat small bites throughout the day along with boost to get as many calories as possible throughout the day.  Patient expressed understanding and was in agreement with this plan. She also understands that She can call clinic at any time with any questions, concerns, or complaints.   Approximately 30 minutes was spent in discussion of which greater than 50% was consultation.   Lloyd Huger, MD   04/03/2016 11:46 PM  Patient was seen and evaluated independently and I agree with the assessment and plan as dictated above.  Lloyd Huger, MD 04/03/16 11:46 PM

## 2016-04-04 ENCOUNTER — Inpatient Hospital Stay: Payer: Medicare Other

## 2016-04-04 ENCOUNTER — Telehealth: Payer: Self-pay | Admitting: *Deleted

## 2016-04-04 ENCOUNTER — Other Ambulatory Visit: Payer: Self-pay | Admitting: *Deleted

## 2016-04-04 ENCOUNTER — Inpatient Hospital Stay: Payer: Medicare Other | Admitting: Oncology

## 2016-04-04 MED ORDER — FENTANYL 25 MCG/HR TD PT72: 25.0000 ug | MEDICATED_PATCH | TRANSDERMAL | 0 refills | Status: AC

## 2016-04-04 NOTE — Telephone Encounter (Signed)
Per Dr Grayland Ormond no labs needed. Left message on VM for [facility]

## 2016-04-04 NOTE — Telephone Encounter (Signed)
Called to inquire if you wants hospice to draw labs on patient since she canellled her appt today. Please advise

## 2016-04-13 ENCOUNTER — Inpatient Hospital Stay
Admission: EM | Admit: 2016-04-13 | Discharge: 2016-04-16 | DRG: 392 | Disposition: A | Discharge status: Hospice | Attending: Internal Medicine | Admitting: Internal Medicine

## 2016-04-13 ENCOUNTER — Emergency Department

## 2016-04-13 ENCOUNTER — Encounter: Payer: Self-pay | Admitting: Emergency Medicine

## 2016-04-13 DIAGNOSIS — Z8542 Personal history of malignant neoplasm of other parts of uterus: Secondary | ICD-10-CM | POA: Diagnosis not present

## 2016-04-13 DIAGNOSIS — N1330 Unspecified hydronephrosis: Secondary | ICD-10-CM

## 2016-04-13 DIAGNOSIS — N183 Chronic kidney disease, stage 3 (moderate): Secondary | ICD-10-CM | POA: Diagnosis present

## 2016-04-13 DIAGNOSIS — Z800 Family history of malignant neoplasm of digestive organs: Secondary | ICD-10-CM

## 2016-04-13 DIAGNOSIS — R102 Pelvic and perineal pain: Secondary | ICD-10-CM | POA: Diagnosis not present

## 2016-04-13 DIAGNOSIS — Z85048 Personal history of other malignant neoplasm of rectum, rectosigmoid junction, and anus: Secondary | ICD-10-CM | POA: Diagnosis not present

## 2016-04-13 DIAGNOSIS — Z79899 Other long term (current) drug therapy: Secondary | ICD-10-CM

## 2016-04-13 DIAGNOSIS — R630 Anorexia: Secondary | ICD-10-CM | POA: Diagnosis present

## 2016-04-13 DIAGNOSIS — N823 Fistula of vagina to large intestine: Secondary | ICD-10-CM | POA: Diagnosis present

## 2016-04-13 DIAGNOSIS — K567 Ileus, unspecified: Secondary | ICD-10-CM | POA: Diagnosis present

## 2016-04-13 DIAGNOSIS — C19 Malignant neoplasm of rectosigmoid junction: Secondary | ICD-10-CM | POA: Diagnosis present

## 2016-04-13 DIAGNOSIS — Z66 Do not resuscitate: Secondary | ICD-10-CM | POA: Diagnosis present

## 2016-04-13 DIAGNOSIS — I129 Hypertensive chronic kidney disease with stage 1 through stage 4 chronic kidney disease, or unspecified chronic kidney disease: Secondary | ICD-10-CM | POA: Diagnosis present

## 2016-04-13 DIAGNOSIS — Z515 Encounter for palliative care: Secondary | ICD-10-CM | POA: Diagnosis present

## 2016-04-13 DIAGNOSIS — A0472 Enterocolitis due to Clostridium difficile, not specified as recurrent: Secondary | ICD-10-CM | POA: Diagnosis present

## 2016-04-13 DIAGNOSIS — E871 Hypo-osmolality and hyponatremia: Secondary | ICD-10-CM | POA: Diagnosis present

## 2016-04-13 DIAGNOSIS — D72829 Elevated white blood cell count, unspecified: Secondary | ICD-10-CM

## 2016-04-13 DIAGNOSIS — I482 Chronic atrial fibrillation: Secondary | ICD-10-CM | POA: Diagnosis present

## 2016-04-13 DIAGNOSIS — Z85038 Personal history of other malignant neoplasm of large intestine: Secondary | ICD-10-CM | POA: Diagnosis not present

## 2016-04-13 DIAGNOSIS — E785 Hyperlipidemia, unspecified: Secondary | ICD-10-CM | POA: Diagnosis present

## 2016-04-13 DIAGNOSIS — Z933 Colostomy status: Secondary | ICD-10-CM

## 2016-04-13 DIAGNOSIS — Z8249 Family history of ischemic heart disease and other diseases of the circulatory system: Secondary | ICD-10-CM

## 2016-04-13 DIAGNOSIS — N179 Acute kidney failure, unspecified: Secondary | ICD-10-CM

## 2016-04-13 DIAGNOSIS — Z79891 Long term (current) use of opiate analgesic: Secondary | ICD-10-CM

## 2016-04-13 DIAGNOSIS — Z8551 Personal history of malignant neoplasm of bladder: Secondary | ICD-10-CM

## 2016-04-13 DIAGNOSIS — C799 Secondary malignant neoplasm of unspecified site: Secondary | ICD-10-CM | POA: Diagnosis present

## 2016-04-13 DIAGNOSIS — K219 Gastro-esophageal reflux disease without esophagitis: Secondary | ICD-10-CM | POA: Diagnosis present

## 2016-04-13 DIAGNOSIS — N189 Chronic kidney disease, unspecified: Secondary | ICD-10-CM

## 2016-04-13 DIAGNOSIS — R0600 Dyspnea, unspecified: Secondary | ICD-10-CM | POA: Diagnosis not present

## 2016-04-13 DIAGNOSIS — C20 Malignant neoplasm of rectum: Secondary | ICD-10-CM | POA: Diagnosis not present

## 2016-04-13 DIAGNOSIS — R112 Nausea with vomiting, unspecified: Secondary | ICD-10-CM

## 2016-04-13 DIAGNOSIS — K529 Noninfective gastroenteritis and colitis, unspecified: Secondary | ICD-10-CM | POA: Diagnosis not present

## 2016-04-13 DIAGNOSIS — E538 Deficiency of other specified B group vitamins: Secondary | ICD-10-CM | POA: Diagnosis present

## 2016-04-13 DIAGNOSIS — Z833 Family history of diabetes mellitus: Secondary | ICD-10-CM

## 2016-04-13 DIAGNOSIS — Z803 Family history of malignant neoplasm of breast: Secondary | ICD-10-CM

## 2016-04-13 LAB — COMPREHENSIVE METABOLIC PANEL
ALT: 14 U/L (ref 14–54)
AST: 20 U/L (ref 15–41)
Albumin: 2.9 g/dL — ABNORMAL LOW (ref 3.5–5.0)
Alkaline Phosphatase: 77 U/L (ref 38–126)
Anion gap: 9 (ref 5–15)
BUN: 35 mg/dL — ABNORMAL HIGH (ref 6–20)
CHLORIDE: 98 mmol/L — AB (ref 101–111)
CO2: 23 mmol/L (ref 22–32)
Calcium: 8.9 mg/dL (ref 8.9–10.3)
Creatinine, Ser: 1.86 mg/dL — ABNORMAL HIGH (ref 0.44–1.00)
GFR, EST AFRICAN AMERICAN: 26 mL/min — AB (ref >60)
GFR, EST NON AFRICAN AMERICAN: 22 mL/min — AB (ref >60)
Glucose, Bld: 147 mg/dL — ABNORMAL HIGH (ref 65–99)
POTASSIUM: 4.4 mmol/L (ref 3.5–5.1)
SODIUM: 130 mmol/L — AB (ref 135–145)
Total Bilirubin: 0.7 mg/dL (ref 0.3–1.2)
Total Protein: 6.9 g/dL (ref 6.5–8.1)

## 2016-04-13 LAB — C DIFFICILE QUICK SCREEN W PCR REFLEX
C Diff antigen: POSITIVE — AB
C Diff toxin: NEGATIVE

## 2016-04-13 LAB — LIPASE, BLOOD: LIPASE: 19 U/L (ref 11–51)

## 2016-04-13 LAB — CBC
HEMATOCRIT: 32.7 % — AB (ref 35.0–47.0)
HEMOGLOBIN: 11.3 g/dL — AB (ref 12.0–16.0)
MCH: 31 pg (ref 26.0–34.0)
MCHC: 34.7 g/dL (ref 32.0–36.0)
MCV: 89.5 fL (ref 80.0–100.0)
PLATELETS: 272 10*3/uL (ref 150–440)
RBC: 3.66 MIL/uL — AB (ref 3.80–5.20)
RDW: 16 % — ABNORMAL HIGH (ref 11.5–14.5)
WBC: 21 10*3/uL — AB (ref 3.6–11.0)

## 2016-04-13 MED ORDER — HYDROCODONE-ACETAMINOPHEN 5-325 MG PO TABS: 1.0000 | ORAL_TABLET | Freq: Three times a day (TID) | ORAL | Status: DC | PRN

## 2016-04-13 MED ORDER — DIGOXIN 125 MCG PO TABS
0.0625 mg | ORAL_TABLET | Freq: Every day | ORAL | Status: DC
  Filled 2016-04-13: qty 0.5

## 2016-04-13 MED ORDER — FERROUS SULFATE 325 (65 FE) MG PO TABS
325.0000 mg | ORAL_TABLET | Freq: Every day | ORAL | Status: DC
Start: 2016-04-14 — End: 2016-04-16
  Administered 2016-04-14 – 2016-04-16 (×3): 325 mg via ORAL
  Filled 2016-04-13 (×3): qty 1

## 2016-04-13 MED ORDER — VITAMIN B-12 1000 MCG PO TABS: 6000.0000 ug | ORAL_TABLET | Freq: Every day | ORAL | Status: DC

## 2016-04-13 MED ORDER — DIGOXIN 125 MCG PO TABS
0.0625 mg | ORAL_TABLET | Freq: Every day | ORAL | Status: DC
  Administered 2016-04-14 – 2016-04-16 (×3): 0.0625 mg via ORAL
  Filled 2016-04-13 (×3): qty 1

## 2016-04-13 MED ORDER — DIATRIZOATE MEGLUMINE & SODIUM 66-10 % PO SOLN
15.0000 mL | ORAL | Status: AC
  Administered 2016-04-13: 15 mL via ORAL

## 2016-04-13 MED ORDER — VITAMIN B-12 1000 MCG PO TABS
6000.0000 ug | ORAL_TABLET | Freq: Every day | ORAL | Status: DC
  Administered 2016-04-14 – 2016-04-16 (×3): 6000 ug via ORAL
  Filled 2016-04-13 (×3): qty 6

## 2016-04-13 MED ORDER — ONDANSETRON HCL 4 MG/2ML IJ SOLN: 4.0000 mg | Freq: Four times a day (QID) | INTRAMUSCULAR | Status: DC | PRN

## 2016-04-13 MED ORDER — SODIUM CHLORIDE 0.9% FLUSH
3.0000 mL | Freq: Two times a day (BID) | INTRAVENOUS | Status: DC
  Administered 2016-04-14 – 2016-04-16 (×5): 3 mL via INTRAVENOUS

## 2016-04-13 MED ORDER — ENSURE ENLIVE PO LIQD
237.0000 mL | Freq: Two times a day (BID) | ORAL | Status: DC
Start: 2016-04-14 — End: 2016-04-16
  Administered 2016-04-14 – 2016-04-16 (×5): 237 mL via ORAL

## 2016-04-13 MED ORDER — DILTIAZEM HCL ER 180 MG PO CP24
180.0000 mg | ORAL_CAPSULE | Freq: Every day | ORAL | Status: DC
  Filled 2016-04-13: qty 1

## 2016-04-13 MED ORDER — HYDROCORTISONE ACETATE 25 MG RE SUPP
25.0000 mg | Freq: Two times a day (BID) | RECTAL | Status: DC | PRN
Start: 2016-04-13 — End: 2016-04-16
  Filled 2016-04-13: qty 1

## 2016-04-13 MED ORDER — ONDANSETRON HCL 4 MG/2ML IJ SOLN
INTRAMUSCULAR | Status: AC
  Administered 2016-04-13: 4 mg via INTRAVENOUS
  Filled 2016-04-13: qty 2

## 2016-04-13 MED ORDER — FENTANYL 25 MCG/HR TD PT72
25.0000 ug | MEDICATED_PATCH | TRANSDERMAL | Status: DC
  Administered 2016-04-15: 21:00:00 25 ug via TRANSDERMAL
  Filled 2016-04-13: qty 1

## 2016-04-13 MED ORDER — VANCOMYCIN HCL IN DEXTROSE 1-5 GM/200ML-% IV SOLN
1000.0000 mg | Freq: Once | INTRAVENOUS | Status: AC
  Administered 2016-04-13: 1000 mg via INTRAVENOUS
  Filled 2016-04-13: qty 200

## 2016-04-13 MED ORDER — PRAVASTATIN SODIUM 10 MG PO TABS
20.0000 mg | ORAL_TABLET | Freq: Every day | ORAL | Status: DC
  Administered 2016-04-13 – 2016-04-15 (×3): 20 mg via ORAL
  Filled 2016-04-13: qty 1
  Filled 2016-04-13 (×2): qty 2

## 2016-04-13 MED ORDER — VANCOMYCIN 50 MG/ML ORAL SOLUTION
250.0000 mg | Freq: Four times a day (QID) | ORAL | Status: DC
  Administered 2016-04-14 (×2): 250 mg via ORAL
  Filled 2016-04-13 (×4): qty 5

## 2016-04-13 MED ORDER — PIPERACILLIN-TAZOBACTAM 3.375 G IVPB 30 MIN
3.3750 g | Freq: Once | INTRAVENOUS | Status: AC
  Administered 2016-04-13: 3.375 g via INTRAVENOUS
  Filled 2016-04-13: qty 50

## 2016-04-13 MED ORDER — VANCOMYCIN 50 MG/ML ORAL SOLUTION: 250.0000 mg | Freq: Four times a day (QID) | ORAL | Status: DC

## 2016-04-13 MED ORDER — ONDANSETRON HCL 4 MG PO TABS: 4.0000 mg | ORAL_TABLET | Freq: Four times a day (QID) | ORAL | Status: DC | PRN

## 2016-04-13 MED ORDER — SODIUM CHLORIDE 0.9 % IV BOLUS (SEPSIS)
1000.0000 mL | Freq: Once | INTRAVENOUS | Status: AC
  Administered 2016-04-13: 1000 mL via INTRAVENOUS

## 2016-04-13 MED ORDER — VANCOMYCIN HCL IN DEXTROSE 1-5 GM/200ML-% IV SOLN: 1000.0000 mg | Freq: Once | INTRAVENOUS | Status: DC

## 2016-04-13 MED ORDER — ACETAMINOPHEN 325 MG PO TABS
650.0000 mg | ORAL_TABLET | Freq: Four times a day (QID) | ORAL | Status: DC | PRN
  Administered 2016-04-13 – 2016-04-15 (×2): 650 mg via ORAL
  Filled 2016-04-13 (×3): qty 2

## 2016-04-13 MED ORDER — APIXABAN 2.5 MG PO TABS
2.5000 mg | ORAL_TABLET | Freq: Two times a day (BID) | ORAL | Status: DC
  Administered 2016-04-13 – 2016-04-15 (×5): 2.5 mg via ORAL
  Filled 2016-04-13 (×6): qty 1

## 2016-04-13 MED ORDER — LIDOCAINE 5 % EX OINT
1.0000 "application " | TOPICAL_OINTMENT | CUTANEOUS | Status: DC | PRN
  Filled 2016-04-13: qty 35.44

## 2016-04-13 MED ORDER — ONDANSETRON HCL 4 MG/2ML IJ SOLN
4.0000 mg | Freq: Once | INTRAMUSCULAR | Status: AC
  Administered 2016-04-13: 4 mg via INTRAVENOUS

## 2016-04-13 MED ORDER — VITAMIN D 1000 UNITS PO TABS
1000.0000 [IU] | ORAL_TABLET | Freq: Every day | ORAL | Status: DC
Start: 2016-04-14 — End: 2016-04-16
  Administered 2016-04-14 – 2016-04-16 (×3): 1000 [IU] via ORAL
  Filled 2016-04-13 (×3): qty 1

## 2016-04-13 MED ORDER — VITAMIN D 1000 UNITS PO TABS: 1000.0000 [IU] | ORAL_TABLET | Freq: Every day | ORAL | Status: DC

## 2016-04-13 MED ORDER — DILTIAZEM HCL ER COATED BEADS 180 MG PO CP24
180.0000 mg | ORAL_CAPSULE | Freq: Every day | ORAL | Status: DC
  Administered 2016-04-14 – 2016-04-16 (×3): 180 mg via ORAL
  Filled 2016-04-13 (×5): qty 1

## 2016-04-13 MED ORDER — SODIUM CHLORIDE 0.9 % IV SOLN
INTRAVENOUS | Status: AC
  Administered 2016-04-14: 04:00:00 via INTRAVENOUS

## 2016-04-13 MED ORDER — PANTOPRAZOLE SODIUM 40 MG PO TBEC
40.0000 mg | DELAYED_RELEASE_TABLET | Freq: Every day | ORAL | Status: DC
  Administered 2016-04-13 – 2016-04-16 (×4): 40 mg via ORAL
  Filled 2016-04-13 (×4): qty 1

## 2016-04-13 NOTE — ED Provider Notes (Signed)
  Physical Exam  BP (!) 155/73   Pulse 93   Temp 97.8 F (36.6 C) (Oral)   Resp (!) 22   Ht 5\' 1"  (1.549 m)   Wt 123 lb (55.8 kg)   SpO2 99%   BMI 23.24 kg/m   Physical Exam  ED Course  Procedures  MDM Care assumed at 4:30 pm from Dr. Cinda Quest. Patient has known rectal cancer with fistula and Colostomy here with vomiting, diarrhea. Recently on keflex for UTI. Labs showed acute renal failure. C diff and CT ab/pel pending. C diff positive antigen but neg toxin so likely carrier but has diarrhea and WBC 21 so will give vancomycin. CT showed R hydro likely from mass vs stricture. Consulted Dr. Louis Meckel, urology, who states that given patient's age and comorbidities, doesn't recommend any surgical interventions. Hospitalist to admit.       Drenda Freeze, MD 04/13/16 [phone]

## 2016-04-13 NOTE — ED Notes (Signed)
Dr. Cinda Quest notified of elevated WBCs.

## 2016-04-13 NOTE — ED Triage Notes (Signed)
Pt arrives via [facility] for N/V/D

## 2016-04-13 NOTE — Progress Notes (Signed)
ED visit made. Patient is currently followed by Hospice and [facility] at home with a hospice diagnosis of Colon Ca. She has been taking Keflex 500 mg QID for a UTI, prescribed on 8/15. She is a Full Code. Code status has been addressed by the hospice team, family wishes to continue full code status. Patient to the ED from home via EMS for evaluation of diarrhea and vomiting. Patient seen lying on the ED stretcher, alert, in distress from nausea caused by CT contrast liquid. Patient only able to drink one bottle. Staff RN Shirlee Limerick notified, IV zofran given. Writer spoke with attending physician's Dr. Cinda Quest and Dr. Darl Householder both aware patient is followed by hospice. Plan is for abdominal CT. Writer provided emotional support to patient;s daughter [name] who was present at bedside. Will continue to follow and update Hospice team. Thank you. Flo Shanks RN, BSN, [facility] [place], [facility] [phone]

## 2016-04-13 NOTE — H&P (Signed)
[facility] NAME: [name]    MR#:  [id number]  DATE OF BIRTH:  Mar 08, 1920  DATE OF ADMISSION:  04/13/2016  PRIMARY CARE PHYSICIAN: [name], MD   REQUESTING/REFERRING PHYSICIAN:   CHIEF COMPLAINT:   Chief Complaint  Patient presents with  . Nausea  . Emesis    HISTORY OF PRESENT ILLNESS: [name]  is a 83 y.o. female with a known history of Pelvic fracture, atrial fibrillation, chronic, on liquids at home, hyperlipidemia, hypertension, gastroesophageal reflux disease, CK D stage III, colovaginal fistula, endometrial and colorectal cancer bladder, diagnosed in 2015, status post colostomy placement, bowel obstruction in the past, who presents to the hospital with complaints of 45 day history of diarrhea, cramping abdominal pain. Apparently patient has been prescribed antibiotic, Keflex for urinary tract infection by primary care physician on 04/05/2016. She started having abdominal pain as well as nausea, vomiting in the morning, she's been having diarrhea for the past for 5 days. Patient complains of abdominal pain, which she describes as intermittent. She is not able to provide intensity of the pain or alleviating countervailing factors. She denies any flank pain. On arrival to the hospital. Patient's stool cultures revealed C. difficile antigen, but not toxin. Labs today showed acute on chronic renal failure with creatinine of 1.8, being 0.95 in April 2017. CT scan of abdomen revealed possible ileus versus small bowel obstruction, right hydronephrosis and hydroureter, rectovaginal fistula. Hospitalist services were contacted for admission.  PAST MEDICAL HISTORY:   Past Medical History:  Diagnosis Date  . A-fib ([place])   . Anemia   . Anemia   . Ankle edema   . 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 Chronic renal insufficiency   . Colovaginal fistula   . Diverticulosis   . Duodenitis   . Elevated serum creatinine   .  Endometrial cancer ([place])   . GERD (gastroesophageal reflux disease)   . Hyperlipidemia   . Hypertension   . Lumbar spondylitis ([place])   . Lumbar spondylosis   . Malignant neoplasm of rectum ([place])   . Small bowel obstruction ([place])   . Vitamin B12 deficiency     PAST SURGICAL HISTORY: Past Surgical History:  Procedure Laterality Date  . ABDOMINAL HYSTERECTOMY    . APPENDECTOMY      SOCIAL HISTORY:  Social History  Substance Use Topics  . Smoking status: Never Smoker  . Smokeless tobacco: Never Used  . Alcohol use No    FAMILY HISTORY:  Family History  Problem Relation Age of Onset  . Diabetes type II Mother   . Coronary artery disease Father   . Tuberculosis Father   . Diabetes type II Brother   . Liver cancer Son   . Colon cancer Son   . Coronary artery disease Brother   . Breast cancer Daughter     DRUG ALLERGIES:  Allergies  Allergen Reactions  . Sulfa Antibiotics Rash  . Meperidine Nausea And Vomiting    Review of Systems  Unable to perform ROS: Dementia    MEDICATIONS AT HOME:  Prior to Admission medications   Medication Sig Start Date End Date Taking? Authorizing Provider  acetaminophen (TYLENOL) 325 MG tablet Take 2 tablets (650 mg total) by mouth every 6 (six) hours as needed for mild pain (or Fever >/= 101). 11/30/15   Dustin Flock, MD  acetaminophen (TYLENOL) 500 MG tablet Take 500 mg by mouth every 6 (six) hours as needed for mild pain.  Historical Provider, MD  apixaban (ELIQUIS) 2.5 MG TABS tablet Take 1 tablet (2.5 mg total) by mouth 2 (two) times daily. 09/10/15   Bettey Costa, MD  cholecalciferol (VITAMIN D) 1000 units tablet Take 1,000 Units by mouth daily.    Historical Provider, MD  Cyanocobalamin (VITAMIN B-12) 6000 MCG SUBL Place 6,000 mcg under the tongue daily.    Historical Provider, MD  diclofenac sodium (VOLTAREN) 1 % GEL Apply 2 g topically 4 (four) times daily as needed (for left shoulder pain).    Historical Provider, MD  digoxin  62.5 MCG TABS Take 0.0625 mg by mouth daily. 11/30/15   Dustin Flock, MD  diltiazem (DILACOR XR) 180 MG 24 hr capsule Take 1 capsule (180 mg total) by mouth daily. 11/30/15   Dustin Flock, MD  docusate sodium (COLACE) 100 MG capsule Take 100 mg by mouth 3 (three) times daily as needed for mild constipation.    Historical Provider, MD  feeding supplement, ENSURE ENLIVE, (ENSURE ENLIVE) LIQD Take 237 mLs by mouth 2 (two) times daily between meals. 09/10/15   Bettey Costa, MD  fentaNYL (DURAGESIC - DOSED MCG/HR) 25 MCG/HR patch Place 1 patch (25 mcg total) onto the skin every 3 (three) days. 04/04/16   Lloyd Huger, MD  ferrous sulfate 325 (65 FE) MG tablet Take 325 mg by mouth daily with breakfast.     Historical Provider, MD  furosemide (LASIX) 20 MG tablet Take 20 mg by mouth daily as needed for edema.     Historical Provider, MD  HYDROcodone-acetaminophen (NORCO/VICODIN) 5-325 MG tablet Take 1 tablet by mouth 3 (three) times daily as needed for moderate pain. 11/30/15   Dustin Flock, MD  hydrocortisone (ANUSOL-HC) 25 MG suppository Place 25 mg rectally 2 (two) times daily as needed for hemorrhoids.     Historical Provider, MD  hydrocortisone 2.5 % cream Apply 1 application topically 2 (two) times daily as needed (for irritation).    Historical Provider, MD  lidocaine (LMX) 4 % cream Apply 1 application topically as needed (for pain).    Historical Provider, MD  nystatin cream (MYCOSTATIN) Apply 1 application topically 2 (two) times daily as needed (for yeast).     Historical Provider, MD  omeprazole (PRILOSEC) 20 MG capsule Take 20 mg by mouth daily.     Historical Provider, MD  polyethylene glycol (MIRALAX / GLYCOLAX) packet Take 17 g by mouth daily.    Historical Provider, MD  potassium chloride (K-DUR) 10 MEQ tablet Take 10 mEq by mouth daily as needed (when taking Lasix).     Historical Provider, MD  pravastatin (PRAVACHOL) 20 MG tablet Take 20 mg by mouth at bedtime.    Historical Provider,  MD      PHYSICAL EXAMINATION:   VITAL SIGNS: Blood pressure (!) 155/73, pulse 93, temperature 97.8 F (36.6 C), temperature source Oral, resp. rate (!) 22, height 5\' 1"  (1.549 m), weight 55.8 kg (123 lb), SpO2 99 %.  GENERAL:  82 y.o.-year-old patient lying in the bed In mild to moderate distress due to abdominal discomfort.  EYES: Pupils equal, round, reactive to light and accommodation. No scleral icterus. Extraocular muscles intact.  HEENT: Head atraumatic, normocephalic. Oropharynx and nasopharynx clear.  NECK:  Supple, no jugular venous distention. No thyroid enlargement, no tenderness.  LUNGS: Normal breath sounds bilaterally, no wheezing, rales,rhonchi or crepitation. No use of accessory muscles of respiration.  CARDIOVASCULAR: S1, S2 , irregularly irregular. No murmurs, rubs, or gallops.  ABDOMEN: Soft, minimally tender diffusely, but no  rebound or guarding, nondistended. Mid abdominal colostomy pouch with liquidy stool. Bowel sounds present. No organomegaly or mass.  EXTREMITIES: No pedal edema, cyanosis, or clubbing.  NEUROLOGIC: Cranial nerves II through XII are intact. Muscle strength 5/5 in all extremities. Sensation intact. Gait not checked.  PSYCHIATRIC: The patient is alert and oriented x 1.  SKIN: No obvious rash, lesion, or ulcer.   LABORATORY PANEL:   CBC  Recent Labs Lab 04/13/16 1312  WBC 21.0*  HGB 11.3*  HCT 32.7*  PLT 272  MCV 89.5  MCH 31.0  MCHC 34.7  RDW 16.0*   ------------------------------------------------------------------------------------------------------------------  Chemistries   Recent Labs Lab 04/13/16 1312  NA 130*  K 4.4  CL 98*  CO2 23  GLUCOSE 147*  BUN 35*  CREATININE 1.86*  CALCIUM 8.9  AST 20  ALT 14  ALKPHOS 77  BILITOT 0.7   ------------------------------------------------------------------------------------------------------------------  Cardiac Enzymes No results for input(s): TROPONINI in the last 168  hours. ------------------------------------------------------------------------------------------------------------------  RADIOLOGY: Ct Abdomen Pelvis Wo Contrast  Result Date: 04/13/2016 CLINICAL DATA:  Elevated WBC, nausea and vomiting, abdominal pain, history of diarrhea EXAM: CT ABDOMEN AND PELVIS WITHOUT CONTRAST TECHNIQUE: Multidetector CT imaging of the abdomen and pelvis was performed following the standard protocol without IV contrast. COMPARISON:  09/10/2014 FINDINGS: The lung bases are unremarkable. The study is limited without IV contrast. Unenhanced liver shows no biliary ductal dilatation. No calcified gallstones are noted within gallbladder. Atherosclerotic calcifications are noted abdominal aorta and bilateral common iliac arteries. No aortic aneurysm. Atherosclerotic calcifications of SMA and splenic artery. Unenhanced pancreas and spleen is unremarkable. Adrenal glands are unremarkable. There is moderate right hydronephrosis and right hydroureter. No nephrolithiasis. No calcified ureteral calculi are noted. Examination of the pelvis is markedly limited by metallic artifacts from right hip prosthesis. Again noted soft tissue density in rectal region digit pole which is poorly visualized. Rectovaginal fistula again noted. There are distended small bowel loops. Findings highly suspicious for ileus or early small bowel obstruction. There is a colostomy in left lower quadrant. The descending colon is decompressed small caliber. Bilateral distal ureter is not identified. Distal right ureteral stricture or tumor invasion cannot be excluded. Correlation with PET scan is recommended. There are degenerative changes pubic symphysis. Sclerotic changes are noted right pubic ramus at the level of pubic symphysis. Metastatic disease cannot be excluded. There is a healing fracture of the right ischium. Pathologic fracture cannot be excluded. Further evaluation with PET scan or bone scan is recommended.  Atherosclerotic calcifications external iliac and bilateral femoral arteries. Sagittal images of the spine shows diffuse osteopenia. Degenerative changes are noted lumbar spine. Significant disc space flattening with endplate sclerotic changes at L5-S1 level. There is about 7 mm anterolisthesis L4 on L5 vertebral body. IMPRESSION: 1. Markedly limited study without IV contrast. Examination of the pelvis is limited by metallic artifact from right hip prosthesis. There is moderate right hydronephrosis and right hydroureter. Distal right ureteral stricture or tumor invasion cannot be excluded. Correlation with PET scan or enhanced study is recommended. 2. No nephrolithiasis.  No calcified ureteral calculi. 3. Again noted lower rectal soft tissue density which is poorly visualized. Rectovaginal fistula again noted. 4. Mild distended small bowel loops are noted within abdomen and pelvis suspicious for ileus or early small bowel obstruction. There is a colostomy in left lower quadrant. The descending colon is decompressed. 5. Sclerotic changes are noted right iliac bone at the level of pubic symphysis. Metastatic disease cannot be excluded. There is a healing fracture  of right ischium Pathologic fracture cannot be excluded. Electronically Signed   By: Lahoma Crocker M.D.   On: 04/13/2016 17:03    EKG: Orders placed or performed during the hospital encounter of 04/13/16  . EKG 12-Lead  . EKG 12-Lead  EKG in the emergency room revealed A. fib, rate of 112, low voltage EKG, nonspecific repolarization abnormality in diffuse leads    IMPRESSION AND PLAN:  Principal Problem:   C. difficile diarrhea Active Problems:   Ileus (HCC)   Acute on chronic renal failure (HCC)   Hyponatremia   Hydronephrosis   Leukocytosis   Clostridium difficile diarrhea #1 C. difficile diarrhea, admit patient to medical floor, initiate her on vancomycin orally, contact precautions, follow clinically, IV fluids, clear liquid diet, if  patient is able to tolerate #2. Nausea and vomiting, unlikely bowel obstruction, since patient has good output via stoma, likely ileus, get KUB in the morning, clear liquid diet, advance diet as tolerated #3. Hyponatremia, initiate patient on IV fluids, follow sodium level in the morning #4. Acute on chronic renal failure, initiate IV fluids, follow urinary output, creatinine #5 hydronephrosis and hydroureter on the right, questionable pathologic obstruction, no kidney stones were noted on CT, emergency room physician discussed with urologist, no interventions are planned, inpatient consultation was not recommended  as well due to poor functional status, get palliative care involved, get oncologist involved as well due to concerns of ureteral obstruction  All the records are reviewed and case discussed with ED provider. Management plans discussed with the patient, family and they are in agreement.  CODE STATUS: Code Status History    Date Active Date Inactive Code Status Order ID Comments User Context   11/27/2015  1:04 AM 12/01/2015 12:27 AM Full Code [id number]  Lance Coon, MD Inpatient   09/08/2015 11:02 PM 09/11/2015  5:55 PM Full Code [id number]  Lance Coon, MD Inpatient    Advance Directive Documentation   Flowsheet Row Most Recent Value  Type of Advance Directive  Living will  Pre-existing out of facility DNR order (yellow form or pink MOST form)  No data  "MOST" Form in Place?  No data       TOTAL TIME TAKING CARE OF THIS PATIENT: 60 minutes.    Theodoro Grist M.D on 04/13/2016 at 6:19 PM  Between 7am to 6pm - Pager - [phone] After 6pm go to [email or web address] - password [email or web address] [facility] [facility]  Office  [phone]  CC: Primary care physician; [name], MD

## 2016-04-13 NOTE — ED Provider Notes (Signed)
[facility] Emergency Department Provider Note   ____________________________________________   First MD Initiated Contact with Patient 04/13/16 1318     (approximate)  I have reviewed the triage vital signs and the nursing notes.   HISTORY  Chief Complaint Nausea and Emesis    HPI Jeannette Giovannoni is a 82 y.o. female who is on Keflex for a UTI. She has a bridge colostomy because she had some cancer and a rectovaginal fistula. She has been having diarrhea both at the colostomy and the rectum for the last day or 2. This morning she woke up and didn't feel too good had a little bit of abdominal pain 8 something laid down in her recliner and went to sleep and then woke up vomiting. Patient still has some mild upper abdominal pain.   Past Medical History:  Diagnosis Date  . A-fib ([place])   . Anemia   . Anemia   . Ankle edema   . 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 Chronic renal insufficiency   . Colovaginal fistula   . Diverticulosis   . Duodenitis   . Elevated serum creatinine   . Endometrial cancer ([place])   . GERD (gastroesophageal reflux disease)   . Hyperlipidemia   . Hypertension   . Lumbar spondylitis ([place])   . Lumbar spondylosis   . Malignant neoplasm of rectum ([place])   . Small bowel obstruction ([place])   . Vitamin B12 deficiency     Patient Active Problem List   Diagnosis Date Noted  . Atrial fibrillation with RVR ([place]) 11/26/2015  . Pelvic fracture ([place]) 11/26/2015  . Rectal cancer ([place]) 11/25/2015  . Stroke ([place]) 09/08/2015  . Paroxysmal a-fib ([place]) 09/08/2015  . HLD (hyperlipidemia) 09/08/2015  . HTN (hypertension) 09/08/2015  . GERD (gastroesophageal reflux disease) 09/08/2015  . CKD (chronic kidney disease), stage III 09/08/2015  . UTI (lower urinary tract infection) 09/08/2015    Past Surgical History:  Procedure Laterality Date  . ABDOMINAL HYSTERECTOMY    . APPENDECTOMY      Prior to Admission medications   Medication Sig Start  Date End Date Taking? Authorizing Provider  acetaminophen (TYLENOL) 325 MG tablet Take 2 tablets (650 mg total) by mouth every 6 (six) hours as needed for mild pain (or Fever >/= 101). 11/30/15   Dustin Flock, MD  acetaminophen (TYLENOL) 500 MG tablet Take 500 mg by mouth every 6 (six) hours as needed for mild pain.    Historical Provider, MD  apixaban (ELIQUIS) 2.5 MG TABS tablet Take 1 tablet (2.5 mg total) by mouth 2 (two) times daily. 09/10/15   Bettey Costa, MD  cholecalciferol (VITAMIN D) 1000 units tablet Take 1,000 Units by mouth daily.    Historical Provider, MD  Cyanocobalamin (VITAMIN B-12) 6000 MCG SUBL Place 6,000 mcg under the tongue daily.    Historical Provider, MD  diclofenac sodium (VOLTAREN) 1 % GEL Apply 2 g topically 4 (four) times daily as needed (for left shoulder pain).    Historical Provider, MD  digoxin 62.5 MCG TABS Take 0.0625 mg by mouth daily. 11/30/15   Dustin Flock, MD  diltiazem (DILACOR XR) 180 MG 24 hr capsule Take 1 capsule (180 mg total) by mouth daily. 11/30/15   Dustin Flock, MD  docusate sodium (COLACE) 100 MG capsule Take 100 mg by mouth 3 (three) times daily as needed for mild constipation.    Historical Provider, MD  feeding supplement, ENSURE ENLIVE, (ENSURE ENLIVE) LIQD Take 237 mLs by mouth 2 (two) times  daily between meals. 09/10/15   Bettey Costa, MD  fentaNYL (DURAGESIC - DOSED MCG/HR) 25 MCG/HR patch Place 1 patch (25 mcg total) onto the skin every 3 (three) days. 04/04/16   Lloyd Huger, MD  ferrous sulfate 325 (65 FE) MG tablet Take 325 mg by mouth daily with breakfast.     Historical Provider, MD  furosemide (LASIX) 20 MG tablet Take 20 mg by mouth daily as needed for edema.     Historical Provider, MD  HYDROcodone-acetaminophen (NORCO/VICODIN) 5-325 MG tablet Take 1 tablet by mouth 3 (three) times daily as needed for moderate pain. 11/30/15   Dustin Flock, MD  hydrocortisone (ANUSOL-HC) 25 MG suppository Place 25 mg rectally 2 (two) times daily  as needed for hemorrhoids.     Historical Provider, MD  hydrocortisone 2.5 % cream Apply 1 application topically 2 (two) times daily as needed (for irritation).    Historical Provider, MD  lidocaine (LMX) 4 % cream Apply 1 application topically as needed (for pain).    Historical Provider, MD  nystatin cream (MYCOSTATIN) Apply 1 application topically 2 (two) times daily as needed (for yeast).     Historical Provider, MD  omeprazole (PRILOSEC) 20 MG capsule Take 20 mg by mouth daily.     Historical Provider, MD  polyethylene glycol (MIRALAX / GLYCOLAX) packet Take 17 g by mouth daily.    Historical Provider, MD  potassium chloride (K-DUR) 10 MEQ tablet Take 10 mEq by mouth daily as needed (when taking Lasix).     Historical Provider, MD  pravastatin (PRAVACHOL) 20 MG tablet Take 20 mg by mouth at bedtime.    Historical Provider, MD    Allergies Sulfa antibiotics and Meperidine  Family History  Problem Relation Age of Onset  . Diabetes type II Mother   . Coronary artery disease Father   . Tuberculosis Father   . Diabetes type II Brother   . Liver cancer Son   . Colon cancer Son   . Coronary artery disease Brother   . Breast cancer Daughter     Social History Social History  Substance Use Topics  . Smoking status: Never Smoker  . Smokeless tobacco: Never Used  . Alcohol use No    Review of Systems Constitutional: No fever/chills Eyes: No visual changes. ENT: No sore throat. Cardiovascular: Denies chest pain. Respiratory: Denies shortness of breath. Gastrointestinal: See history of present illness Genitourinary: Negative for dysuria. Musculoskeletal: Negative for back pain. Skin: Negative for rash. Neurological: Negative for headaches, focal weakness or numbness.  10-point ROS otherwise negative.  ____________________________________________   PHYSICAL EXAM:  VITAL SIGNS: ED Triage Vitals  Enc Vitals Group     BP --      Pulse --      Resp 04/13/16 1253 18      Temp 04/13/16 1253 97.8 F (36.6 C)     Temp Source 04/13/16 1253 Oral     SpO2 04/13/16 1253 99 %     Weight 04/13/16 1256 123 lb (55.8 kg)     Height 04/13/16 1256 5\' 1"  (1.549 m)     Head Circumference --      Peak Flow --      Pain Score --      Pain Loc --      Pain Edu? --      Excl. in [place]? --     Constitutional: Alert and oriented. Well appearing and in no acute distress. Eyes: Conjunctivae are normal. PERRL. EOMI. Head: Atraumatic. Nose:  No congestion/rhinnorhea. Mouth/Throat: Mucous membranes are moist.  Oropharynx non-erythematous. Neck: No stridor.  Cardiovascular: Normal rate, regular rhythm. Grossly normal heart sounds.  Good peripheral circulation. Respiratory: Normal respiratory effort.  No retractions. Lungs CTAB. Gastrointestinal: Soft and mild diffuse upper abdominal tenderness No distention. No abdominal bruits. No CVA tenderness. Musculoskeletal: No lower extremity tenderness nor edema.  No joint effusions. Neurologic:  Normal speech and language. No gross focal neurologic deficits are appreciated. No gait instability.   ____________________________________________   LABS (all labs ordered are listed, but only abnormal results are displayed)  Labs Reviewed  COMPREHENSIVE METABOLIC PANEL - Abnormal; Notable for the following:       Result Value   Sodium 130 (*)    Chloride 98 (*)    Glucose, Bld 147 (*)    BUN 35 (*)    Creatinine, Ser 1.86 (*)    Albumin 2.9 (*)    GFR calc non Af Amer 22 (*)    GFR calc Af Amer 26 (*)    All other components within normal limits  CBC - Abnormal; Notable for the following:    WBC 21.0 (*)    RBC 3.66 (*)    Hemoglobin 11.3 (*)    HCT 32.7 (*)    RDW 16.0 (*)    All other components within normal limits  C DIFFICILE QUICK SCREEN W PCR REFLEX  LIPASE, BLOOD   ____________________________________________  EKG  EKG read and inserted by me shows A. fib at a rate of 112 normal axis diffuse ST segment  depression ____________________________________________  RADIOLOGY  Pending ____________________________________________   PROCEDURES  ] Procedures   ____________________________________________   INITIAL IMPRESSION / ASSESSMENT AND PLAN / ED COURSE  Pertinent labs & imaging results that were available during my care of the patient were reviewed by me and considered in my medical decision making (see chart for details).  Patient signed out to Dr. Darl Householder  Clinical Course     ____________________________________________   FINAL CLINICAL IMPRESSION(S) / ED DIAGNOSES  Final diagnoses:  Nausea and vomiting, vomiting of unspecified type      NEW MEDICATIONS STARTED DURING THIS VISIT:  New Prescriptions   No medications on file     Note:  This document was prepared using Dragon voice recognition software and may include unintentional dictation errors.    Nena Polio, MD 04/13/16 [phone]

## 2016-04-14 ENCOUNTER — Inpatient Hospital Stay

## 2016-04-14 DIAGNOSIS — K529 Noninfective gastroenteritis and colitis, unspecified: Principal | ICD-10-CM

## 2016-04-14 DIAGNOSIS — R5383 Other fatigue: Secondary | ICD-10-CM

## 2016-04-14 DIAGNOSIS — I129 Hypertensive chronic kidney disease with stage 1 through stage 4 chronic kidney disease, or unspecified chronic kidney disease: Secondary | ICD-10-CM

## 2016-04-14 DIAGNOSIS — R531 Weakness: Secondary | ICD-10-CM

## 2016-04-14 DIAGNOSIS — N189 Chronic kidney disease, unspecified: Secondary | ICD-10-CM

## 2016-04-14 DIAGNOSIS — R630 Anorexia: Secondary | ICD-10-CM

## 2016-04-14 DIAGNOSIS — C20 Malignant neoplasm of rectum: Secondary | ICD-10-CM

## 2016-04-14 DIAGNOSIS — Z803 Family history of malignant neoplasm of breast: Secondary | ICD-10-CM

## 2016-04-14 DIAGNOSIS — R5381 Other malaise: Secondary | ICD-10-CM

## 2016-04-14 DIAGNOSIS — Z7901 Long term (current) use of anticoagulants: Secondary | ICD-10-CM

## 2016-04-14 DIAGNOSIS — R102 Pelvic and perineal pain: Secondary | ICD-10-CM

## 2016-04-14 DIAGNOSIS — Z800 Family history of malignant neoplasm of digestive organs: Secondary | ICD-10-CM

## 2016-04-14 DIAGNOSIS — Z79899 Other long term (current) drug therapy: Secondary | ICD-10-CM

## 2016-04-14 DIAGNOSIS — K219 Gastro-esophageal reflux disease without esophagitis: Secondary | ICD-10-CM

## 2016-04-14 DIAGNOSIS — E538 Deficiency of other specified B group vitamins: Secondary | ICD-10-CM

## 2016-04-14 DIAGNOSIS — N179 Acute kidney failure, unspecified: Secondary | ICD-10-CM

## 2016-04-14 LAB — CBC
HCT: 26.3 % — ABNORMAL LOW (ref 35.0–47.0)
HEMOGLOBIN: 8.9 g/dL — AB (ref 12.0–16.0)
MCH: 30.4 pg (ref 26.0–34.0)
MCHC: 33.9 g/dL (ref 32.0–36.0)
MCV: 89.4 fL (ref 80.0–100.0)
Platelets: 252 10*3/uL (ref 150–440)
RBC: 2.94 MIL/uL — ABNORMAL LOW (ref 3.80–5.20)
RDW: 16 % — ABNORMAL HIGH (ref 11.5–14.5)
WBC: 15.9 10*3/uL — AB (ref 3.6–11.0)

## 2016-04-14 LAB — CLOSTRIDIUM DIFFICILE BY PCR: CDIFFPCR: NEGATIVE

## 2016-04-14 LAB — BASIC METABOLIC PANEL
Anion gap: 7 (ref 5–15)
BUN: 30 mg/dL — ABNORMAL HIGH (ref 6–20)
CALCIUM: 7.8 mg/dL — AB (ref 8.9–10.3)
CHLORIDE: 104 mmol/L (ref 101–111)
CO2: 23 mmol/L (ref 22–32)
CREATININE: 1.77 mg/dL — AB (ref 0.44–1.00)
GFR, EST AFRICAN AMERICAN: 27 mL/min — AB (ref >60)
GFR, EST NON AFRICAN AMERICAN: 23 mL/min — AB (ref >60)
Glucose, Bld: 93 mg/dL (ref 65–99)
Potassium: 4 mmol/L (ref 3.5–5.1)
SODIUM: 134 mmol/L — AB (ref 135–145)

## 2016-04-14 MED ORDER — RISAQUAD PO CAPS
1.0000 | ORAL_CAPSULE | Freq: Two times a day (BID) | ORAL | Status: DC
  Administered 2016-04-14 – 2016-04-16 (×5): 1 via ORAL
  Filled 2016-04-14 (×5): qty 1

## 2016-04-14 MED ORDER — NYSTATIN 100000 UNIT/GM EX CREA
TOPICAL_CREAM | Freq: Two times a day (BID) | CUTANEOUS | Status: DC
  Administered 2016-04-14 – 2016-04-15 (×4): via TOPICAL
  Administered 2016-04-16: 1 via TOPICAL
  Filled 2016-04-14: qty 15

## 2016-04-14 MED ORDER — SODIUM CHLORIDE 0.9 % IV SOLN
INTRAVENOUS | Status: DC
  Administered 2016-04-14 (×2): via INTRAVENOUS

## 2016-04-14 NOTE — Progress Notes (Signed)
Pt transferred to 1C Oncology for continued care. Report given to [place], Therapist, sports. Transported pt with family at bedside.

## 2016-04-14 NOTE — Progress Notes (Signed)
C Diff PCR result not visible in [facility].  I spoke with Crystal in lab who reports that PCR was negative.  Hubert Azure, MSN, RN, CIC

## 2016-04-14 NOTE — Consult Note (Signed)
[facility] Nurse ostomy consult note Stoma type/location: LLQ Colostomy since 2015.  Stomal assessment/size: 1 1/2" round pink and moist Peristomal assessment: Intact per daughter.  Not changed today. Was applied at home yesterday.  Treatment options for stomal/peristomal skin: None Output Soft brown stool  Ostomy pouching: 2pc. 2 1/4" pouch.   Education provided: Supplies left in room for ongoing care.  Family has no questions regarding care.   Enrolled patient in [place] program: No Will not follow at this time.  Please re-consult if needed.  Domenic Moras RN BSN [place] Pager [phone]

## 2016-04-14 NOTE — Progress Notes (Signed)
Palliative Medicine consult noted. Due to high referral volume, there may be a delay seeing this patient. Please call the Palliative Medicine Team office at [phone] if recommendations are needed in the interim.  Thank you for inviting [place] to see this patient.   Marjie Skiff Sunya Humbarger, RN, BSN, [facility] 04/14/2016 11:11 AM Cell [phone] 8:00-4:00 Monday-Friday Office [phone]

## 2016-04-14 NOTE — Consult Note (Signed)
[name]  [phone] [phone]  ID: Anders Grant OB: 05-Jul-1920  MR#: [id number]  [id number]  Patient Care Team: [name], MD as PCP - General (Family Medicine) Progressive r CHIEF COMPLAINT: Progressive rectal cancer on hospice, acute gastroenteritis, acute renal failure.  INTERVAL HISTORY: Patient is a 82 year old female currently on hospice for progressive rectal cancer. She has not been seen in the [facility] since April 2017. She was admitted to the hospital with increasing diarrhea, nausea, and vomiting. She continues to have pelvic pain. Patient states her symptoms have improved since admission, but she does not feel back to her baseline. She is no neurologic complaints. She denies any fevers. She has a poor appetite, but denies weight loss. She has no urinary complaints. Patient otherwise feels well and offers no further specific complaints.  REVIEW OF SYSTEMS:   Review of Systems  Constitutional: Positive for malaise/fatigue. Negative for fever and weight loss.  Respiratory: Negative.  Negative for cough and shortness of breath.   Cardiovascular: Negative.  Negative for chest pain.  Gastrointestinal: Positive for abdominal pain, diarrhea and nausea.  Genitourinary: Negative.   Musculoskeletal: Negative.   Neurological: Positive for weakness.  Psychiatric/Behavioral: Negative.     As per HPI. Otherwise, a complete review of systems is negatve.  PAST MEDICAL HISTORY: Past Medical History:  Diagnosis Date  . A-fib ([place])   . Anemia   . Anemia   . Ankle edema   . Aortic ectasia ([place])   . Chronic renal insufficiency   . Colovaginal fistula   . Diverticulosis   . Duodenitis   . Elevated serum creatinine   . Endometrial cancer ([place])   . GERD (gastroesophageal reflux disease)   . Hyperlipidemia   . Hypertension   . Lumbar spondylitis ([place])   . Lumbar spondylosis   . Malignant neoplasm of rectum ([place])   .  Small bowel obstruction ([place])   . Vitamin B12 deficiency     PAST SURGICAL HISTORY: Past Surgical History:  Procedure Laterality Date  . ABDOMINAL HYSTERECTOMY    . APPENDECTOMY      FAMILY HISTORY: Family History  Problem Relation Age of Onset  . Diabetes type II Mother   . Coronary artery disease Father   . Tuberculosis Father   . Diabetes type II Brother   . Liver cancer Son   . Colon cancer Son   . Coronary artery disease Brother   . Breast cancer Daughter        ADVANCED DIRECTIVES (Y/N):  @ADVDIR @   HEALTH MAINTENANCE: Social History  Substance Use Topics  . Smoking status: Never Smoker  . Smokeless tobacco: Never Used  . Alcohol use No     Colonoscopy:  PAP:  Bone density:  Lipid panel:  Allergies  Allergen Reactions  . Sulfa Antibiotics Rash  . Meperidine Nausea And Vomiting    Current Facility-Administered Medications  Medication Dose Route Frequency Provider Last Rate Last Dose  . 0.9 %  sodium chloride infusion   Intravenous Continuous Theodoro Grist, MD 125 mL/hr at 04/14/16 0355    . acetaminophen (TYLENOL) tablet 650 mg  650 mg Oral Q6H PRN Theodoro Grist, MD   650 mg at 04/13/16 2119  . acidophilus (RISAQUAD) capsule 1 capsule  1 capsule Oral BID Gladstone Lighter, MD   1 capsule at 04/14/16 1418  . apixaban (ELIQUIS) tablet 2.5 mg  2.5 mg Oral BID Theodoro Grist, MD   2.5 mg at 04/14/16 1042  . cholecalciferol (VITAMIN D)  tablet 1,000 Units  1,000 Units Oral Daily Theodoro Grist, MD   1,000 Units at 04/14/16 1041  . digoxin (LANOXIN) tablet 0.0625 mg  0.0625 mg Oral Daily Theodoro Grist, MD   0.0625 mg at 04/14/16 1044  . diltiazem (DILACOR XR) 24 hr capsule 180 mg  180 mg Oral Daily Theodoro Grist, MD   180 mg at 04/14/16 1041  . feeding supplement (ENSURE ENLIVE) (ENSURE ENLIVE) liquid 237 mL  237 mL Oral BID BM Theodoro Grist, MD   237 mL at 04/14/16 1400  . fentaNYL (DURAGESIC - dosed mcg/hr) 25 mcg  25 mcg Transdermal Q3 days Theodoro Grist, MD       . ferrous sulfate tablet 325 mg  325 mg Oral Q breakfast Theodoro Grist, MD   325 mg at 04/14/16 1040  . HYDROcodone-acetaminophen (NORCO/VICODIN) 5-325 MG per tablet 1 tablet  1 tablet Oral TID PRN Theodoro Grist, MD      . hydrocortisone (ANUSOL-HC) suppository 25 mg  25 mg Rectal BID PRN Theodoro Grist, MD      . lidocaine (XYLOCAINE) 5 % ointment 1 application  1 application Topical PRN Theodoro Grist, MD      . nystatin cream (MYCOSTATIN)   Topical BID Gladstone Lighter, MD      . ondansetron (ZOFRAN) tablet 4 mg  4 mg Oral Q6H PRN Theodoro Grist, MD       Or  . ondansetron (ZOFRAN) injection 4 mg  4 mg Intravenous Q6H PRN Theodoro Grist, MD      . pantoprazole (PROTONIX) EC tablet 40 mg  40 mg Oral Daily Theodoro Grist, MD   40 mg at 04/14/16 1041  . pravastatin (PRAVACHOL) tablet 20 mg  20 mg Oral QHS Theodoro Grist, MD   20 mg at 04/13/16 2120  . sodium chloride flush (NS) 0.9 % injection 3 mL  3 mL Intravenous Q12H Theodoro Grist, MD   3 mL at 04/14/16 1000  . vitamin B-12 (CYANOCOBALAMIN) tablet 6,000 mcg  6,000 mcg Oral Daily Theodoro Grist, MD   6,000 mcg at 04/14/16 1418    OBJECTIVE: Vitals:   04/14/16 1140 04/14/16 1204  BP: (!) 124/56 (!) 143/58  Pulse: 77 88  Resp: 16 18  Temp: 97.3 F (36.3 C) 97.8 F (36.6 C)     Body mass index is 22.64 kg/m.    ECOG FS:3 - Symptomatic, >50% confined to bed  General: Well-developed, well-nourished, no acute distress. Eyes: Pink conjunctiva, anicteric sclera. HEENT: Normocephalic, moist mucous membranes, clear oropharnyx. Lungs: Clear to auscultation bilaterally. Heart: Regular rate and rhythm. No rubs, murmurs, or gallops. Abdomen: Soft, nontender, nondistended. Colostomy bag noted. Musculoskeletal: No edema, cyanosis, or clubbing. Neuro: Alert, answering all questions appropriately. Cranial nerves grossly intact. Skin: No rashes or petechiae noted. Psych: Normal affect. Lymphatics: No cervical, calvicular, axillary or inguinal  LAD.   LAB RESULTS:  Lab Results  Component Value Date   NA 134 (L) 04/14/2016   K 4.0 04/14/2016   CL 104 04/14/2016   CO2 23 04/14/2016   GLUCOSE 93 04/14/2016   BUN 30 (H) 04/14/2016   CREATININE 1.77 (H) 04/14/2016   CALCIUM 7.8 (L) 04/14/2016   PROT 6.9 04/13/2016   ALBUMIN 2.9 (L) 04/13/2016   AST 20 04/13/2016   ALT 14 04/13/2016   ALKPHOS 77 04/13/2016   BILITOT 0.7 04/13/2016   GFRNONAA 23 (L) 04/14/2016   GFRAA 27 (L) 04/14/2016    Lab Results  Component Value Date   WBC 15.9 (H) 04/14/2016  NEUTROABS 11.7 (H) 12/16/2015   HGB 8.9 (L) 04/14/2016   HCT 26.3 (L) 04/14/2016   MCV 89.4 04/14/2016   PLT 252 04/14/2016     STUDIES: Ct Abdomen Pelvis Wo Contrast  Result Date: 04/13/2016 CLINICAL DATA:  Elevated WBC, nausea and vomiting, abdominal pain, history of diarrhea EXAM: CT ABDOMEN AND PELVIS WITHOUT CONTRAST TECHNIQUE: Multidetector CT imaging of the abdomen and pelvis was performed following the standard protocol without IV contrast. COMPARISON:  09/10/2014 FINDINGS: The lung bases are unremarkable. The study is limited without IV contrast. Unenhanced liver shows no biliary ductal dilatation. No calcified gallstones are noted within gallbladder. Atherosclerotic calcifications are noted abdominal aorta and bilateral common iliac arteries. No aortic aneurysm. Atherosclerotic calcifications of SMA and splenic artery. Unenhanced pancreas and spleen is unremarkable. Adrenal glands are unremarkable. There is moderate right hydronephrosis and right hydroureter. No nephrolithiasis. No calcified ureteral calculi are noted. Examination of the pelvis is markedly limited by metallic artifacts from right hip prosthesis. Again noted soft tissue density in rectal region digit pole which is poorly visualized. Rectovaginal fistula again noted. There are distended small bowel loops. Findings highly suspicious for ileus or early small bowel obstruction. There is a colostomy in left  lower quadrant. The descending colon is decompressed small caliber. Bilateral distal ureter is not identified. Distal right ureteral stricture or tumor invasion cannot be excluded. Correlation with PET scan is recommended. There are degenerative changes pubic symphysis. Sclerotic changes are noted right pubic ramus at the level of pubic symphysis. Metastatic disease cannot be excluded. There is a healing fracture of the right ischium. Pathologic fracture cannot be excluded. Further evaluation with PET scan or bone scan is recommended. Atherosclerotic calcifications external iliac and bilateral femoral arteries. Sagittal images of the spine shows diffuse osteopenia. Degenerative changes are noted lumbar spine. Significant disc space flattening with endplate sclerotic changes at L5-S1 level. There is about 7 mm anterolisthesis L4 on L5 vertebral body. IMPRESSION: 1. Markedly limited study without IV contrast. Examination of the pelvis is limited by metallic artifact from right hip prosthesis. There is moderate right hydronephrosis and right hydroureter. Distal right ureteral stricture or tumor invasion cannot be excluded. Correlation with PET scan or enhanced study is recommended. 2. No nephrolithiasis.  No calcified ureteral calculi. 3. Again noted lower rectal soft tissue density which is poorly visualized. Rectovaginal fistula again noted. 4. Mild distended small bowel loops are noted within abdomen and pelvis suspicious for ileus or early small bowel obstruction. There is a colostomy in left lower quadrant. The descending colon is decompressed. 5. Sclerotic changes are noted right iliac bone at the level of pubic symphysis. Metastatic disease cannot be excluded. There is a healing fracture of right ischium Pathologic fracture cannot be excluded. Electronically Signed   By: Lahoma Crocker M.D.   On: 04/13/2016 17:03   Dg Abd 1 View  Result Date: 04/14/2016 CLINICAL DATA:  Ileus. EXAM: ABDOMEN - 1 VIEW COMPARISON:   CT abdomen/ pelvis yesterday per FINDINGS: Enteric contrast from prior CT is seen within the ascending, transverse, and proximal descending colon. Prominent but not dilated small bowel loops in the pelvis. No evidence of free air. Multiple pelvic surgical clips. Right hip prosthesis. IMPRESSION: Normal bowel gas pattern with enteric contrast in the colon from CT performed yesterday. No bowel dilatation to suggest obstruction or ileus. Electronically Signed   By: Jeb Levering M.D.   On: 04/14/2016 06:20    ASSESSMENT: Progressive rectal cancer on hospice, acute gastroenteritis, acute renal failure.  PLAN:    1. Progressive breast cancer: CT scan results reviewed independently and reported as above. Previously, it was determined patient is no longer a surgical candidate. She can no longer receive additional XRT. Given her advanced age and decreased performance status, chemotherapy is not recommended. Recommend continuation of hospice upon discharge. 2. Pain: Continue current narcotic regimen as prescribed. 3. Acute gastroenteritis: Continue current IV antibiotics. Fluid resuscitation is necessary. 4. Acute renal failure: Improving. Likely secondary to dehydration. 5. Disposition: Continue hospice upon discharge. No follow-up is necessary in the [facility].  Appreciate consult, call with questions.   Patient expressed understanding and was in agreement with this plan. She also understands that She can call clinic at any time with any questions, concerns, or complaints.   No matching staging information was found for the patient.  Lloyd Huger, MD   04/14/2016 4:29 PM

## 2016-04-14 NOTE — Progress Notes (Signed)
Pt is alert with intermittent confusion leading to anxiety at times, relieved with rest, daughter at bedside, colostomy bag with small amount of output and gas, poor appetite, on room air, iv fluids infusing, po vancomycin d/c, started on probiotic, consulted with oncology who provided update to daughter via telephone, pt c/o intermittent abdominal pain that is relieved by emptying gas from colostomy bag. Uneventful shift.

## 2016-04-14 NOTE — Progress Notes (Signed)
[facility] [place] NAME: [name]    MR#:  [id number]  DATE OF BIRTH:  04/08/1920  SUBJECTIVE:  CHIEF COMPLAINT:   Chief Complaint  Patient presents with  . Nausea  . Emesis   - diarrhea in colostomy bag, abdominal pain present. - c.diff negative - daughter at bedside  REVIEW OF SYSTEMS:  Review of Systems  Constitutional: Positive for malaise/fatigue. Negative for chills and fever.  HENT: Negative for ear discharge, ear pain, nosebleeds and tinnitus.   Respiratory: Negative for cough, shortness of breath and wheezing.   Cardiovascular: Negative for chest pain, palpitations and leg swelling.  Gastrointestinal: Positive for abdominal pain, diarrhea and nausea. Negative for constipation and vomiting.  Genitourinary: Negative for dysuria and urgency.  Musculoskeletal: Negative for myalgias.  Neurological: Negative for dizziness, sensory change, speech change, focal weakness, seizures and headaches.  Psychiatric/Behavioral: Negative for depression.    DRUG ALLERGIES:   Allergies  Allergen Reactions  . Sulfa Antibiotics Rash  . Meperidine Nausea And Vomiting    VITALS:  Blood pressure (!) 143/58, pulse 88, temperature 97.8 F (36.6 C), temperature source Oral, resp. rate 18, height 5\' 2"  (1.575 m), weight 56.2 kg (123 lb 12.8 oz), SpO2 99 %.  PHYSICAL EXAMINATION:  Physical Exam  GENERAL:  82 y.o.-year-old patient lying in the bed with no acute distress.  EYES: Pupils equal, round, reactive to light and accommodation. No scleral icterus. Extraocular muscles intact.  HEENT: Head atraumatic, normocephalic. Oropharynx and nasopharynx clear.  NECK:  Supple, no jugular venous distention. No thyroid enlargement, no tenderness.  LUNGS: Normal breath sounds bilaterally, no wheezing, rales,rhonchi or crepitation. No use of accessory muscles of respiration.  CARDIOVASCULAR: S1, S2 normal. No murmurs, rubs, or gallops.    ABDOMEN: Soft, some discomfort on palpation in peri-umbilical area, nondistended. Bowel sounds present. No organomegaly or mass.  Colostomy bag in place EXTREMITIES: No pedal edema, cyanosis, or clubbing.  NEUROLOGIC: Cranial nerves II through XII are intact. Muscle strength 5/5 in all extremities. Sensation intact. Gait not checked.  PSYCHIATRIC: The patient is alert and oriented x 3.  SKIN: No obvious rash, lesion, or ulcer.    LABORATORY PANEL:   CBC  Recent Labs Lab 04/14/16 0510  WBC 15.9*  HGB 8.9*  HCT 26.3*  PLT 252   ------------------------------------------------------------------------------------------------------------------  Chemistries   Recent Labs Lab 04/13/16 1312 04/14/16 0510  NA 130* 134*  K 4.4 4.0  CL 98* 104  CO2 23 23  GLUCOSE 147* 93  BUN 35* 30*  CREATININE 1.86* 1.77*  CALCIUM 8.9 7.8*  AST 20  --   ALT 14  --   ALKPHOS 77  --   BILITOT 0.7  --    ------------------------------------------------------------------------------------------------------------------  Cardiac Enzymes No results for input(s): TROPONINI in the last 168 hours. ------------------------------------------------------------------------------------------------------------------  RADIOLOGY:  Ct Abdomen Pelvis Wo Contrast  Result Date: 04/13/2016 CLINICAL DATA:  Elevated WBC, nausea and vomiting, abdominal pain, history of diarrhea EXAM: CT ABDOMEN AND PELVIS WITHOUT CONTRAST TECHNIQUE: Multidetector CT imaging of the abdomen and pelvis was performed following the standard protocol without IV contrast. COMPARISON:  09/10/2014 FINDINGS: The lung bases are unremarkable. The study is limited without IV contrast. Unenhanced liver shows no biliary ductal dilatation. No calcified gallstones are noted within gallbladder. Atherosclerotic calcifications are noted abdominal aorta and bilateral common iliac arteries. No aortic aneurysm. Atherosclerotic calcifications of SMA and  splenic artery. Unenhanced pancreas and spleen is unremarkable. Adrenal glands are unremarkable. There  is moderate right hydronephrosis and right hydroureter. No nephrolithiasis. No calcified ureteral calculi are noted. Examination of the pelvis is markedly limited by metallic artifacts from right hip prosthesis. Again noted soft tissue density in rectal region digit pole which is poorly visualized. Rectovaginal fistula again noted. There are distended small bowel loops. Findings highly suspicious for ileus or early small bowel obstruction. There is a colostomy in left lower quadrant. The descending colon is decompressed small caliber. Bilateral distal ureter is not identified. Distal right ureteral stricture or tumor invasion cannot be excluded. Correlation with PET scan is recommended. There are degenerative changes pubic symphysis. Sclerotic changes are noted right pubic ramus at the level of pubic symphysis. Metastatic disease cannot be excluded. There is a healing fracture of the right ischium. Pathologic fracture cannot be excluded. Further evaluation with PET scan or bone scan is recommended. Atherosclerotic calcifications external iliac and bilateral femoral arteries. Sagittal images of the spine shows diffuse osteopenia. Degenerative changes are noted lumbar spine. Significant disc space flattening with endplate sclerotic changes at L5-S1 level. There is about 7 mm anterolisthesis L4 on L5 vertebral body. IMPRESSION: 1. Markedly limited study without IV contrast. Examination of the pelvis is limited by metallic artifact from right hip prosthesis. There is moderate right hydronephrosis and right hydroureter. Distal right ureteral stricture or tumor invasion cannot be excluded. Correlation with PET scan or enhanced study is recommended. 2. No nephrolithiasis.  No calcified ureteral calculi. 3. Again noted lower rectal soft tissue density which is poorly visualized. Rectovaginal fistula again noted. 4. Mild  distended small bowel loops are noted within abdomen and pelvis suspicious for ileus or early small bowel obstruction. There is a colostomy in left lower quadrant. The descending colon is decompressed. 5. Sclerotic changes are noted right iliac bone at the level of pubic symphysis. Metastatic disease cannot be excluded. There is a healing fracture of right ischium Pathologic fracture cannot be excluded. Electronically Signed   By: Lahoma Crocker M.D.   On: 04/13/2016 17:03   Dg Abd 1 View  Result Date: 04/14/2016 CLINICAL DATA:  Ileus. EXAM: ABDOMEN - 1 VIEW COMPARISON:  CT abdomen/ pelvis yesterday per FINDINGS: Enteric contrast from prior CT is seen within the ascending, transverse, and proximal descending colon. Prominent but not dilated small bowel loops in the pelvis. No evidence of free air. Multiple pelvic surgical clips. Right hip prosthesis. IMPRESSION: Normal bowel gas pattern with enteric contrast in the colon from CT performed yesterday. No bowel dilatation to suggest obstruction or ileus. Electronically Signed   By: Jeb Levering M.D.   On: 04/14/2016 06:20    EKG:   Orders placed or performed during the hospital encounter of 04/13/16  . EKG 12-Lead  . EKG 12-Lead    ASSESSMENT AND PLAN:   82 y/o F with PMH of pelvic fracture, Afib, HTN, GERD, CKD stage 3, colovaginal fistula, remote history of endometrial history and colorectal cancer admitted with abdominal pain and diarrhea  1. Acute gastroenteritis- c.diff negative - discontinue vancomycin - continue fluids,  - advance diet - probiotics  2. ARF- right hydronephrosis, right hydroureter, may be tumor invasion at the distal urethral end - IV fluids - avoid nephrotoxins  3. Colorectal cancer- metastatic, s/p colostomy  Pain control Monitor colostomy output Continue fentanyl patch  4. Afib - cardizem, on eliquis. Also on digoxin  5. DVT Prophylaxis- eliquis   All the records are reviewed and case discussed with Care  Management/Social Workerr. Management plans discussed with the patient, family  and they are in agreement.  CODE STATUS: Full Code  TOTAL TIME TAKING CARE OF THIS PATIENT: 37 minutes.   POSSIBLE D/C IN 1-2 DAYS, DEPENDING ON CLINICAL CONDITION.   Gladstone Lighter M.D on 04/14/2016 at 2:18 PM  Between 7am to 6pm - Pager - [phone]  After 6pm go to [email or web address] - password [email or web address] [facility]  Office  [phone]  CC: Primary care physician; [name], MD

## 2016-04-14 NOTE — Care Management Important Message (Signed)
Important Message  Patient Details  Name: [name] MRN: [id number] Date of Birth: [birth year]   Medicare Important Message Given:  Yes    Katrina Stack, RN 04/14/2016, 10:17 AM

## 2016-04-14 NOTE — Progress Notes (Signed)
Spoke with family this AM regarding pt transfer to Oncology floor. Spoke with MD Tressia Miners). Transfer requested for pt to Oncology floor, feel that care would be more appropriate considering pt's diagnosis and status. MD and family in agreement. Bed request in process.

## 2016-04-14 NOTE — Progress Notes (Signed)
Visit made. Patient seen lying in bed, alert, quietly interactive. Daughter [name] at bedside. She reports that Mrs. Lovett did eat some lunch, her diet has been advanced to soft. Per chart note review and conversation with staff RN Velna Hatchet, attending physician has discontinued the IV antibiotics, Oncology consult is pending. Wound/ostomy nurse visited earlier this afternoon. Patient reports some intermittent "grabing pain" in her lower abdomen, she has not requested any PRN pain medications today. Fentanyl patch, 25 mcg in place to right shoulder blade. Due to be changed on 8/26. Possible discharge tomorrow. Please notify [facility] if patient discharges over the weekend. Flo Shanks RN, BSN, [place] [facility] [place], hospital Liaison [phone]

## 2016-04-15 LAB — BASIC METABOLIC PANEL
Anion gap: 6 (ref 5–15)
BUN: 32 mg/dL — ABNORMAL HIGH (ref 6–20)
CALCIUM: 7.8 mg/dL — AB (ref 8.9–10.3)
CO2: 22 mmol/L (ref 22–32)
CREATININE: 1.62 mg/dL — AB (ref 0.44–1.00)
Chloride: 106 mmol/L (ref 101–111)
GFR, EST AFRICAN AMERICAN: 30 mL/min — AB (ref >60)
GFR, EST NON AFRICAN AMERICAN: 26 mL/min — AB (ref >60)
Glucose, Bld: 105 mg/dL — ABNORMAL HIGH (ref 65–99)
Potassium: 4.1 mmol/L (ref 3.5–5.1)
SODIUM: 134 mmol/L — AB (ref 135–145)

## 2016-04-15 LAB — CBC
HCT: 25.5 % — ABNORMAL LOW (ref 35.0–47.0)
Hemoglobin: 8.8 g/dL — ABNORMAL LOW (ref 12.0–16.0)
MCH: 31.4 pg (ref 26.0–34.0)
MCHC: 34.4 g/dL (ref 32.0–36.0)
MCV: 91.3 fL (ref 80.0–100.0)
PLATELETS: 225 10*3/uL (ref 150–440)
RBC: 2.79 MIL/uL — AB (ref 3.80–5.20)
RDW: 16.1 % — ABNORMAL HIGH (ref 11.5–14.5)
WBC: 10.8 10*3/uL (ref 3.6–11.0)

## 2016-04-15 MED ORDER — FUROSEMIDE 10 MG/ML IJ SOLN
20.0000 mg | Freq: Once | INTRAMUSCULAR | Status: AC
  Administered 2016-04-15: 12:00:00 20 mg via INTRAVENOUS
  Filled 2016-04-15: qty 2

## 2016-04-15 NOTE — Evaluation (Signed)
Physical Therapy Evaluation Patient Details Name: [name] MRN: [id number] DOB: June 10, 1920 Today's Date: 04/15/2016   History of Present Illness  82 y/o female here with diarrhea, found to be c. diff negative.  She has colo-rectal cancer and is on hospice currently at [facility].  She is having abdominal pain but otherwise is able to particiapte with PT.   Clinical Impression  Pt is able to do some limited ambulation around the EOB and though she was very fatigued afterward and had some pain with the effort. Pt needed some help getting up to sitting but despite feeling poorly was able to be functionally mobile for limited activity.  Pt currently on hospice, interested in continuing PT to work on strength, mobility and ambulation.     Follow Up Recommendations Home health PT (at [facility])    Equipment Recommendations       Recommendations for Other Services       Precautions / Restrictions Precautions Precautions: Fall Restrictions Weight Bearing Restrictions: No      Mobility  Bed Mobility Overal bed mobility: Needs Assistance Bed Mobility: Supine to Sit     Supine to sit: Mod assist;Min assist     General bed mobility comments: Pt shows good effort getting to sidelying, needs assist to get LEs off bed and to raise trunk  Transfers Overall transfer level: Modified independent Equipment used: Rolling walker (2 wheeled)             General transfer comment: Pt was able to rise to standing with minimal cuing and though she was slow she was safe and stable.  Ambulation/Gait Ambulation/Gait assistance: Min guard;Min assist Ambulation Distance (Feet): 15 Feet Assistive device: Rolling walker (2 wheeled)       General Gait Details: Pt with slow, guarded ambulation and c/o considerable fatigue (and gradually increasing pain) with the effort.   Stairs            Wheelchair Mobility    Modified Rankin (Stroke Patients Only)       Balance                                              Pertinent Vitals/Pain Pain Assessment:  (pt reports abdominal/vaginal pain - nursing notified)    Home Living Family/patient expects to be discharged to:: Assisted living               Home Equipment: Walker - 2 wheels      Prior Function Level of Independence: Needs assistance         Comments: Pt able to walk to bathroom and do some limited ambulation.  Has help with bathing, etc     Hand Dominance        Extremity/Trunk Assessment   Upper Extremity Assessment: Generalized weakness (very limited AROM, shoulder painful L>R)           Lower Extremity Assessment: Generalized weakness (grossly 3+ to 4-/5 within limited ROM tolerance)         Communication   Communication: HOH  Cognition Arousal/Alertness: Awake/alert Behavior During Therapy: WFL for tasks assessed/performed Overall Cognitive Status: History of cognitive impairments - at baseline                      General Comments      Exercises        Assessment/Plan  PT Assessment Patient needs continued PT services  PT Diagnosis Difficulty walking;Generalized weakness   PT Problem List Decreased strength;Decreased activity tolerance;Decreased range of motion;Decreased balance;Decreased mobility;Decreased coordination;Decreased cognition;Decreased knowledge of use of DME;Decreased safety awareness;Pain  PT Treatment Interventions Gait training;Therapeutic activities;Therapeutic exercise;Patient/family education;DME instruction;Balance training;Functional mobility training   PT Goals (Current goals can be found in the Care Plan section) Acute Rehab PT Goals Patient Stated Goal: Go back to [facility] PT Goal Formulation: With patient/family Time For Goal Achievement: 04/29/16 Potential to Achieve Goals: Fair    Frequency Min 2X/week   Barriers to discharge        Co-evaluation               End of Session  Equipment Utilized During Treatment: Gait belt Activity Tolerance: Patient limited by fatigue;Patient limited by pain Patient left: with family/visitor present;with chair alarm set;with call bell/phone within reach Nurse Communication: Mobility status         Time: [id number] PT Time Calculation (min) (ACUTE ONLY): 28 min   Charges:   PT Evaluation $PT Eval Low Complexity: 1 Procedure     PT G CodesKreg Shropshire, DPT 04/15/2016, 3:38 PM

## 2016-04-15 NOTE — Progress Notes (Addendum)
[place] at [place] NAME: [name]    MR#:  [id number]  DATE OF BIRTH:  [birth year]  SUBJECTIVE:  CHIEF COMPLAINT:   Chief Complaint  Patient presents with  . Nausea  . Emesis   - Improving diarrhea, poor sleep last night, confusion present today. - c.diff negative - daughter at bedside  REVIEW OF SYSTEMS:  Review of Systems  Constitutional: Positive for malaise/fatigue. Negative for chills and fever.  HENT: Negative for ear discharge, ear pain, nosebleeds and tinnitus.   Respiratory: Negative for cough, shortness of breath and wheezing.   Cardiovascular: Negative for chest pain, palpitations and leg swelling.  Gastrointestinal: Positive for abdominal pain, diarrhea and nausea. Negative for constipation and vomiting.  Genitourinary: Negative for dysuria and urgency.  Musculoskeletal: Positive for joint pain. Negative for myalgias.       Left shoulder joint pain- chronic  Neurological: Negative for dizziness, sensory change, speech change, focal weakness, seizures and headaches.  Psychiatric/Behavioral: Negative for depression.    DRUG ALLERGIES:   Allergies  Allergen Reactions  . Sulfa Antibiotics Rash  . Meperidine Nausea And Vomiting    VITALS:  Blood pressure (!) 143/68, pulse (!) 110, temperature 98.2 F (36.8 C), temperature source Oral, resp. rate 20, height 5\' 2"  (1.575 m), weight 56.2 kg (123 lb 12.8 oz), SpO2 97 %.  PHYSICAL EXAMINATION:  Physical Exam  GENERAL:  82 y.o.-year-old patient lying in the bed with no acute distress.  EYES: Pupils equal, round, reactive to light and accommodation. No scleral icterus. Extraocular muscles intact.  HEENT: Head atraumatic, normocephalic. Oropharynx and nasopharynx clear.  NECK:  Supple, no jugular venous distention. No thyroid enlargement, no tenderness.  LUNGS: Normal breath sounds bilaterally, no wheezing, rales,rhonchi or crepitation. No use of accessory  muscles of respiration.  CARDIOVASCULAR: S1, S2 normal. No murmurs, rubs, or gallops.  ABDOMEN: Soft, nontender, nondistended. Bowel sounds present. No organomegaly or mass.  Colostomy bag in place EXTREMITIES: No pedal edema, cyanosis, or clubbing.  NEUROLOGIC: Cranial nerves II through XII are intact. Muscle strength 5/5 in all extremities. Sensation intact. Gait not checked.  PSYCHIATRIC: The patient is alert and oriented x 2. Confusion present  SKIN: No obvious rash, lesion, or ulcer.    LABORATORY PANEL:   CBC  Recent Labs Lab 04/15/16 0513  WBC 10.8  HGB 8.8*  HCT 25.5*  PLT 225   ------------------------------------------------------------------------------------------------------------------  Chemistries   Recent Labs Lab 04/13/16 1312  04/15/16 0513  NA 130*  < > 134*  K 4.4  < > 4.1  CL 98*  < > 106  CO2 23  < > 22  GLUCOSE 147*  < > 105*  BUN 35*  < > 32*  CREATININE 1.86*  < > 1.62*  CALCIUM 8.9  < > 7.8*  AST 20  --   --   ALT 14  --   --   ALKPHOS 77  --   --   BILITOT 0.7  --   --   < > = values in this interval not displayed. ------------------------------------------------------------------------------------------------------------------  Cardiac Enzymes No results for input(s): TROPONINI in the last 168 hours. ------------------------------------------------------------------------------------------------------------------  RADIOLOGY:  Ct Abdomen Pelvis Wo Contrast  Result Date: 04/13/2016 CLINICAL DATA:  Elevated WBC, nausea and vomiting, abdominal pain, history of diarrhea EXAM: CT ABDOMEN AND PELVIS WITHOUT CONTRAST TECHNIQUE: Multidetector CT imaging of the abdomen and pelvis was performed following the standard protocol without IV contrast. COMPARISON:  09/10/2014 FINDINGS: The  lung bases are unremarkable. The study is limited without IV contrast. Unenhanced liver shows no biliary ductal dilatation. No calcified gallstones are noted within  gallbladder. Atherosclerotic calcifications are noted abdominal aorta and bilateral common iliac arteries. No aortic aneurysm. Atherosclerotic calcifications of SMA and splenic artery. Unenhanced pancreas and spleen is unremarkable. Adrenal glands are unremarkable. There is moderate right hydronephrosis and right hydroureter. No nephrolithiasis. No calcified ureteral calculi are noted. Examination of the pelvis is markedly limited by metallic artifacts from right hip prosthesis. Again noted soft tissue density in rectal region digit pole which is poorly visualized. Rectovaginal fistula again noted. There are distended small bowel loops. Findings highly suspicious for ileus or early small bowel obstruction. There is a colostomy in left lower quadrant. The descending colon is decompressed small caliber. Bilateral distal ureter is not identified. Distal right ureteral stricture or tumor invasion cannot be excluded. Correlation with PET scan is recommended. There are degenerative changes pubic symphysis. Sclerotic changes are noted right pubic ramus at the level of pubic symphysis. Metastatic disease cannot be excluded. There is a healing fracture of the right ischium. Pathologic fracture cannot be excluded. Further evaluation with PET scan or bone scan is recommended. Atherosclerotic calcifications external iliac and bilateral femoral arteries. Sagittal images of the spine shows diffuse osteopenia. Degenerative changes are noted lumbar spine. Significant disc space flattening with endplate sclerotic changes at L5-S1 level. There is about 7 mm anterolisthesis L4 on L5 vertebral body. IMPRESSION: 1. Markedly limited study without IV contrast. Examination of the pelvis is limited by metallic artifact from right hip prosthesis. There is moderate right hydronephrosis and right hydroureter. Distal right ureteral stricture or tumor invasion cannot be excluded. Correlation with PET scan or enhanced study is recommended. 2. No  nephrolithiasis.  No calcified ureteral calculi. 3. Again noted lower rectal soft tissue density which is poorly visualized. Rectovaginal fistula again noted. 4. Mild distended small bowel loops are noted within abdomen and pelvis suspicious for ileus or early small bowel obstruction. There is a colostomy in left lower quadrant. The descending colon is decompressed. 5. Sclerotic changes are noted right iliac bone at the level of pubic symphysis. Metastatic disease cannot be excluded. There is a healing fracture of right ischium Pathologic fracture cannot be excluded. Electronically Signed   By: Lahoma Crocker M.D.   On: 04/13/2016 17:03   Dg Abd 1 View  Result Date: 04/14/2016 CLINICAL DATA:  Ileus. EXAM: ABDOMEN - 1 VIEW COMPARISON:  CT abdomen/ pelvis yesterday per FINDINGS: Enteric contrast from prior CT is seen within the ascending, transverse, and proximal descending colon. Prominent but not dilated small bowel loops in the pelvis. No evidence of free air. Multiple pelvic surgical clips. Right hip prosthesis. IMPRESSION: Normal bowel gas pattern with enteric contrast in the colon from CT performed yesterday. No bowel dilatation to suggest obstruction or ileus. Electronically Signed   By: Jeb Levering M.D.   On: 04/14/2016 06:20    EKG:   Orders placed or performed during the hospital encounter of 04/13/16  . EKG 12-Lead  . EKG 12-Lead    ASSESSMENT AND PLAN:   82 y/o F with PMH of pelvic fracture, Afib, HTN, GERD, CKD stage 3, colovaginal fistula, remote history of endometrial history and colorectal cancer admitted with abdominal pain and diarrhea  1. Acute gastroenteritis- c.diff negative - discontinue vancomycin, wbc normal now,  - advance diet - probiotics  2. ARF- right hydronephrosis, right hydroureter, may be tumor invasion at the distal urethral end -  avoid nephrotoxins - creatinine improving  3. Colorectal cancer- metastatic, s/p colostomy  Pain control Monitor colostomy  output Continue fentanyl patch  4. Afib - cardizem, on eliquis. Also on digoxin  5. DVT Prophylaxis- eliquis  6. Dyspnea- likely from IV fluids- discontinue fluids and give 1 dose of lasix and monitor sats are stable  Plan to work with physical therapy today   All the records are reviewed and case discussed with Care Management/Social Workerr. Management plans discussed with the patient, family and they are in agreement.  CODE STATUS: Full Code  TOTAL TIME TAKING CARE OF THIS PATIENT: 37 minutes.   POSSIBLE D/C tomorrow, DEPENDING ON CLINICAL CONDITION.   Levonia Wolfley M.D on 04/15/2016 at 1:07 PM  Between 7am to 6pm - Pager - [phone]  After 6pm go to [email or web address] - password [email or web address] [facility]  Office  [phone]  CC: Primary care physician; [name], MD

## 2016-04-15 NOTE — Progress Notes (Signed)
Sle[facility] in the morning and early afternoon. Family and friend in to visit. PT consult done with pain reported initially which resolved with rest with no further interventions noted. Lasix IV given with voids incontinent. Colostomy intact. No n/v today;tolerating supplements well. Hospice updated via TC with Mariann Laster; anticipating discharge home tomorrow.

## 2016-04-16 MED ORDER — RISAQUAD PO CAPS: 1.0000 | ORAL_CAPSULE | Freq: Two times a day (BID) | ORAL | 0 refills | Status: AC

## 2016-04-16 NOTE — Discharge Summary (Signed)
[employer] [facility] at [place] NAME: [name]    MR#:  [id number]  DATE OF BIRTH:  27-Feb-1920  DATE OF ADMISSION:  04/13/2016   ADMITTING PHYSICIAN: Theodoro Grist, MD  DATE OF DISCHARGE: 04/16/2016  PRIMARY CARE PHYSICIAN: [name], MD   ADMISSION DIAGNOSIS:   Ileus ([place]) [K56.7] C. difficile diarrhea [A04.7] Nausea and vomiting, vomiting of unspecified type [R11.2]  DISCHARGE DIAGNOSIS:   Principal Problem:   C. difficile diarrhea Active Problems:   Ileus ([place])   Acute on chronic renal failure (HCC)   Hyponatremia   Leukocytosis   Hydronephrosis   Clostridium difficile diarrhea   SECONDARY DIAGNOSIS:   Past Medical History:  Diagnosis Date  . A-fib ([place])   . Anemia   . Anemia   . Ankle edema   . 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 Chronic renal insufficiency   . Colovaginal fistula   . Diverticulosis   . Duodenitis   . Elevated serum creatinine   . Endometrial cancer ([place])   . GERD (gastroesophageal reflux disease)   . Hyperlipidemia   . Hypertension   . Lumbar spondylitis ([place])   . Lumbar spondylosis   . Malignant neoplasm of rectum ([place])   . Small bowel obstruction ([place])   . Vitamin B12 deficiency     HOSPITAL COURSE:    82 y/o F with PMH of pelvic fracture, Afib, HTN, GERD, CKD stage 3, colovaginal fistula, remote history of endometrial history and colorectal cancer admitted with abdominal pain and diarrhea  1. Acute gastroenteritis- c.diff negative - wbc normal now,  - advanced diet, tolerating diet - probiotics  2. ARF- right hydronephrosis, right hydroureter, may be tumor invasion at the distal urethral end - avoid nephrotoxins - creatinine improving  3. Colorectal cancer- metastatic, s/p colostomy  Pain control Monitor colostomy output - which has improved Continue fentanyl patch  4. Afib - cardizem, and on digoxin Patient received eliquis in the hospital, however family notified that  eliquis has been discontinued by PCP about 10 days ago. So, not restarting at discharge. Discuss with PCP  5. Dyspnea-  from IV fluids-resolved after stopping the fluids and getting a dose of Lasix sats are stable  Overall poor prognosis. Can be discharged home with hospice services  DISCHARGE CONDITIONS:   Guarded CONSULTS OBTAINED:   Treatment Team:  Lloyd Huger, MD  DRUG ALLERGIES:   Allergies  Allergen Reactions  . Sulfa Antibiotics Rash  . Meperidine Nausea And Vomiting   DISCHARGE MEDICATIONS:     Medication List    STOP taking these medications   apixaban 2.5 MG Tabs tablet Commonly known as:  ELIQUIS   polyethylene glycol packet Commonly known as:  MIRALAX / GLYCOLAX     TAKE these medications   acetaminophen 500 MG tablet Commonly known as:  TYLENOL Take 500 mg by mouth every 6 (six) hours as needed for mild pain. What changed:  Another medication with the same name was removed. Continue taking this medication, and follow the directions you see here.   acidophilus Caps capsule Take 1 capsule by mouth 2 (two) times daily.   cholecalciferol 1000 units tablet Commonly known as:  VITAMIN D Take 1,000 Units by mouth daily.   diclofenac sodium 1 % Gel Commonly known as:  VOLTAREN Apply 2 g topically 4 (four) times daily as needed (for left shoulder pain).   Digoxin 62.5 MCG Tabs Take 0.0625 mg by mouth daily.   diltiazem 180 MG  24 hr capsule Commonly known as:  DILACOR XR Take 1 capsule (180 mg total) by mouth daily.   docusate sodium 100 MG capsule Commonly known as:  COLACE Take 100 mg by mouth 3 (three) times daily as needed for mild constipation.   feeding supplement (ENSURE ENLIVE) Liqd Take 237 mLs by mouth 2 (two) times daily between meals.   fentaNYL 25 MCG/HR patch Commonly known as:  DURAGESIC - dosed mcg/hr Place 1 patch (25 mcg total) onto the skin every 3 (three) days.   ferrous sulfate 325 (65 FE) MG tablet Take 325 mg by  mouth daily with breakfast.   furosemide 20 MG tablet Commonly known as:  LASIX Take 20 mg by mouth daily as needed for edema.   HYDROcodone-acetaminophen 5-325 MG tablet Commonly known as:  NORCO/VICODIN Take 1 tablet by mouth 3 (three) times daily as needed for moderate pain.   hydrocortisone 2.5 % cream Apply 1 application topically 2 (two) times daily as needed (for irritation).   hydrocortisone 25 MG suppository Commonly known as:  ANUSOL-HC Place 25 mg rectally 2 (two) times daily as needed for hemorrhoids.   lidocaine 4 % cream Commonly known as:  LMX Apply 1 application topically as needed (for pain).   nystatin cream Commonly known as:  MYCOSTATIN Apply 1 application topically 2 (two) times daily as needed (for yeast).   omeprazole 20 MG capsule Commonly known as:  PRILOSEC Take 20 mg by mouth daily.   potassium chloride 10 MEQ tablet Commonly known as:  K-DUR Take 10 mEq by mouth daily as needed (when taking Lasix).   pravastatin 20 MG tablet Commonly known as:  PRAVACHOL Take 20 mg by mouth at bedtime.   Vitamin B-12 6000 MCG Subl Place 6,000 mcg under the tongue daily.        DISCHARGE INSTRUCTIONS:   1. Resume hospice care 2. PCP follow-up in 1 week  DIET:   Regular diet  ACTIVITY:   Activity as tolerated  OXYGEN:   Home Oxygen: No.  Oxygen Delivery: room air  DISCHARGE LOCATION:   home   If you experience worsening of your admission symptoms, develop shortness of breath, life threatening emergency, suicidal or homicidal thoughts you must seek medical attention immediately by calling 911 or calling your MD immediately  if symptoms less severe.  You Must read complete instructions/literature along with all the possible adverse reactions/side effects for all the Medicines you take and that have been prescribed to you. Take any new Medicines after you have completely understood and accpet all the possible adverse reactions/side effects.    Please note  You were cared for by a hospitalist during your hospital stay. If you have any questions about your discharge medications or the care you received while you were in the hospital after you are discharged, you can call the unit and asked to speak with the hospitalist on call if the hospitalist that took care of you is not available. Once you are discharged, your primary care physician will handle any further medical issues. Please note that NO REFILLS for any discharge medications will be authorized once you are discharged, as it is imperative that you return to your primary care physician (or establish a relationship with a primary care physician if you do not have one) for your aftercare needs so that they can reassess your need for medications and monitor your lab values.    On the day of Discharge:  VITAL SIGNS:   Blood pressure (!) 137/53,  pulse 95, temperature 97.8 F (36.6 C), temperature source Oral, resp. rate 20, height 5\' 2"  (1.575 m), weight 56.2 kg (123 lb 12.8 oz), SpO2 99 %.  PHYSICAL EXAMINATION:    GENERAL:  82 y.o.-year-old patient lying in the bed with no acute distress.  EYES: Pupils equal, round, reactive to light and accommodation. No scleral icterus. Extraocular muscles intact.  HEENT: Head atraumatic, normocephalic. Oropharynx and nasopharynx clear.  NECK:  Supple, no jugular venous distention. No thyroid enlargement, no tenderness.  LUNGS: Normal breath sounds bilaterally, no wheezing, rales,rhonchi or crepitation. No use of accessory muscles of respiration. decreased bibasilar breath sounds CARDIOVASCULAR: S1, S2 normal. No murmurs, rubs, or gallops.  ABDOMEN: Soft, nontender, nondistended. Bowel sounds present. No organomegaly or mass.  Colostomy bag in place with soft stool  EXTREMITIES: No pedal edema, cyanosis, or clubbing.  NEUROLOGIC: Cranial nerves II through XII are intact. Muscle strength 5/5 in all extremities. Sensation intact. Gait not  checked.  PSYCHIATRIC: The patient is alert and oriented x 2. Confusion present  SKIN: No obvious rash, lesion, or ulcer.    DATA REVIEW:   CBC  Recent Labs Lab 04/15/16 0513  WBC 10.8  HGB 8.8*  HCT 25.5*  PLT 225    Chemistries   Recent Labs Lab 04/13/16 1312  04/15/16 0513  NA 130*  < > 134*  K 4.4  < > 4.1  CL 98*  < > 106  CO2 23  < > 22  GLUCOSE 147*  < > 105*  BUN 35*  < > 32*  CREATININE 1.86*  < > 1.62*  CALCIUM 8.9  < > 7.8*  AST 20  --   --   ALT 14  --   --   ALKPHOS 77  --   --   BILITOT 0.7  --   --   < > = values in this interval not displayed.   Microbiology Results  Results for orders placed or performed during the hospital encounter of 04/13/16  C difficile quick scan w PCR reflex     Status: Abnormal   Collection Time: 04/13/16  3:01 PM  Result Value Ref Range Status   C Diff antigen POSITIVE (A) NEGATIVE Final   C Diff toxin NEGATIVE NEGATIVE Final   C Diff interpretation Results are indeterminate. See PCR results.  Final  Clostridium Difficile by PCR     Status: None   Collection Time: 04/13/16  3:01 PM  Result Value Ref Range Status   Toxigenic C Difficile by pcr NEGATIVE NEGATIVE Corrected    Comment: Patient is colonized with non toxigenic C. difficile. May not need treatment unless significant symptoms are present. CORRECTED ON 08/25 AT 1032: PREVIOUSLY REPORTED AS NEGATIVE     RADIOLOGY:  No results found.   Management plans discussed with the patient, family and they are in agreement.  CODE STATUS:     Code Status Orders        Start     Ordered   04/13/16 2012  Full code  Continuous     04/13/16 2011    Code Status History    Date Active Date Inactive Code Status Order ID Comments User Context   11/27/2015  1:04 AM 12/01/2015 12:27 AM Full Code [id number]  Lance Coon, MD Inpatient   09/08/2015 11:02 PM 09/11/2015  5:55 PM Full Code [id number]  Lance Coon, MD Inpatient    Advance Directive Documentation   Flowsheet  Row Most Recent Value  Type of Advance  Directive  Living will  Pre-existing out of facility DNR order (yellow form or pink MOST form)  No data  "MOST" Form in Place?  No data      TOTAL TIME TAKING CARE OF THIS PATIENT: 37 minutes.    Gladstone Lighter M.D on 04/16/2016 at 9:11 AM  Between 7am to 6pm - Pager - [phone]  After 6pm go to [email or web address] - Proofreader  [facility]  Office  [phone]  CC: Primary care physician; [name], MD   Note: This dictation was prepared with Dragon dictation along with smaller phrase technology. Any transcriptional errors that result from this process are unintentional.

## 2016-04-16 NOTE — Progress Notes (Signed)
Having loose soft brown stool in colostomy bag. Tolerated small breakfast and supplement this morning. Friend here. Dgt requested EMS transport due to pt condition in that she is limited on being able to get in and out of car. Jeani Hawking, case manager, aware with referral to home hospice made and will prepare for EMS transport.

## 2016-04-16 NOTE — Care Management Note (Signed)
Case Management Note  Patient Details  Name: [name] MRN: [id number] Date of Birth: 1919-12-05  Subjective/Objective:         Copy of Summary was faxed to Lorelle Formosa at [facility] of A/C   Also discussed discharge with Mariann Laster on the phone.  Action/Plan:   Expected Discharge Date:                  Expected Discharge Plan:     In-House Referral:     Discharge planning Services     Post Acute Care Choice:    Choice offered to:     DME Arranged:    DME Agency:     HH Arranged:    HH Agency:     Status of Service:     If discussed at [employer] of Stay Meetings, dates discussed:    Additional Comments:  Gabryel Talamo A, RN 04/16/2016, 10:05 AM

## 2016-04-16 NOTE — Progress Notes (Signed)
Oral and written AVS instructions given to pt and dgt with stated understanding agreement to be discharged home with hospice with residence at [facility]. EMS here for transport and released to their care.

## 2016-04-16 NOTE — Discharge Instructions (Signed)
Diarrhea  Diarrhea is watery poop (stool). It can make you feel weak, tired, thirsty, or give you a dry mouth (signs of dehydration). Watery poop is a sign of another problem, most often an infection. It often lasts 2-3 days. It can last longer if it is a sign of something serious. Take care of yourself as told by your doctor.  HOME CARE   · Drink 1 cup (8 ounces) of fluid each time you have watery poop.  · Do not drink the following fluids:    Those that contain simple sugars (fructose, glucose, galactose, lactose, sucrose, maltose).    Sports drinks.    Fruit juices.    Whole milk products.    Sodas.    Drinks with caffeine (coffee, tea, soda) or alcohol.  · Oral rehydration solution may be used if the doctor says it is okay. You may make your own solution. Follow this recipe:    ?-? teaspoon table salt.    ¾ teaspoon baking soda.    ? teaspoon salt substitute containing potassium chloride.    1 ? tablespoons sugar.    1 liter (34 ounces) of water.  · Avoid the following foods:    High fiber foods, such as raw fruits and vegetables.    Nuts, seeds, and whole grain breads and cereals.     Those that are sweetened with sugar alcohols (xylitol, sorbitol, mannitol).  · Try eating the following foods:    Starchy foods, such as rice, toast, pasta, low-sugar cereal, oatmeal, baked potatoes, crackers, and bagels.    Bananas.    Applesauce.  · Eat probiotic-rich foods, such as yogurt and milk products that are fermented.  · Wash your hands well after each time you have watery poop.  · Only take medicine as told by your doctor.  · Take a warm bath to help lessen burning or pain from having watery poop.  GET HELP RIGHT AWAY IF:   · You cannot drink fluids without throwing up (vomiting).  · You keep throwing up.  · You have blood in your poop, or your poop looks black and tarry.  · You do not pee (urinate) in 6-8 hours, or there is only a small amount of very dark pee.  · You have belly (abdominal) pain that gets worse or  stays in the same spot (localizes).  · You are weak, dizzy, confused, or light-headed.  · You have a very bad headache.  · Your watery poop gets worse or does not get better.  · You have a fever or lasting symptoms for more than 2-3 days.  · You have a fever and your symptoms suddenly get worse.  MAKE SURE YOU:   · Understand these instructions.  · Will watch your condition.  · Will get help right away if you are not doing well or get worse.     This information is not intended to replace advice given to you by your health care provider. Make sure you discuss any questions you have with your health care provider.     Document Released: 01/24/2008 Document Revised: 08/28/2014 Document Reviewed: 04/14/2012  Elsevier Interactive Patient Education ©2016 [employer]

## 2016-04-16 NOTE — Plan of Care (Addendum)
Problem: Bowel/Gastric: Goal: Will not experience complications related to bowel motility Outcome: Progressing Confused at times. BM noted in colostomy bag this am. Tylenol prn for pain with improvement. Pt's family refused for pt to get her eliquis 2.5 mg at bedtime. They stated that Dr Maretta Los has discontinued on 04/05/16. Continue to monitor.

## 2016-05-09 ENCOUNTER — Telehealth: Payer: Self-pay | Admitting: *Deleted

## 2016-05-09 NOTE — Telephone Encounter (Signed)
Called to report that daughter is concerned due to the amt of anal area bleeding patient is having and is asking about checking labs on her. Please advise

## 2016-05-09 NOTE — Telephone Encounter (Signed)
Informed [name] that per Dr Grayland Ormond, he is not ordering labs on her because he is not going to do anything about results, so he does not want to put her through a lab draw

## 2016-09-21 DEATH — deceased

## 2017-04-25 IMAGING — CR DG SHOULDER 2+V*L*
3 series · 3 of 3 positions shown · non-contrast
Comparison: None.

CLINICAL DATA: Persistent left shoulder pain with left arm weakness
since falling last year. Limited range of motion.

EXAM:
LEFT SHOULDER - 2+ VIEW

[shoulder grashey (1 of 2)]
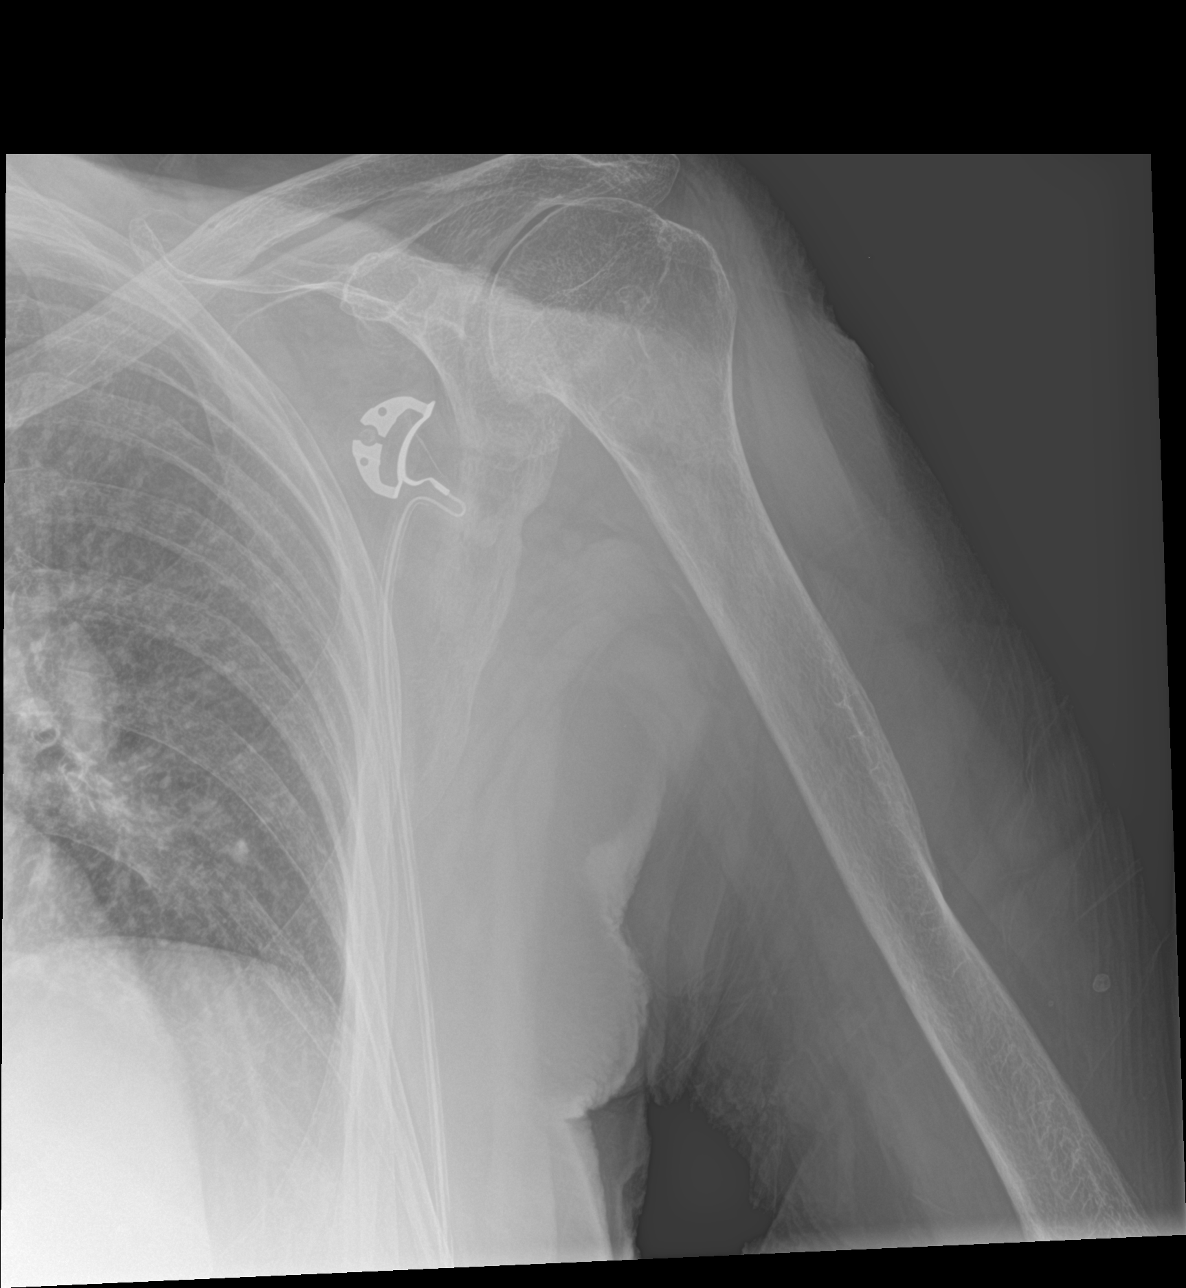

[shoulder y view]
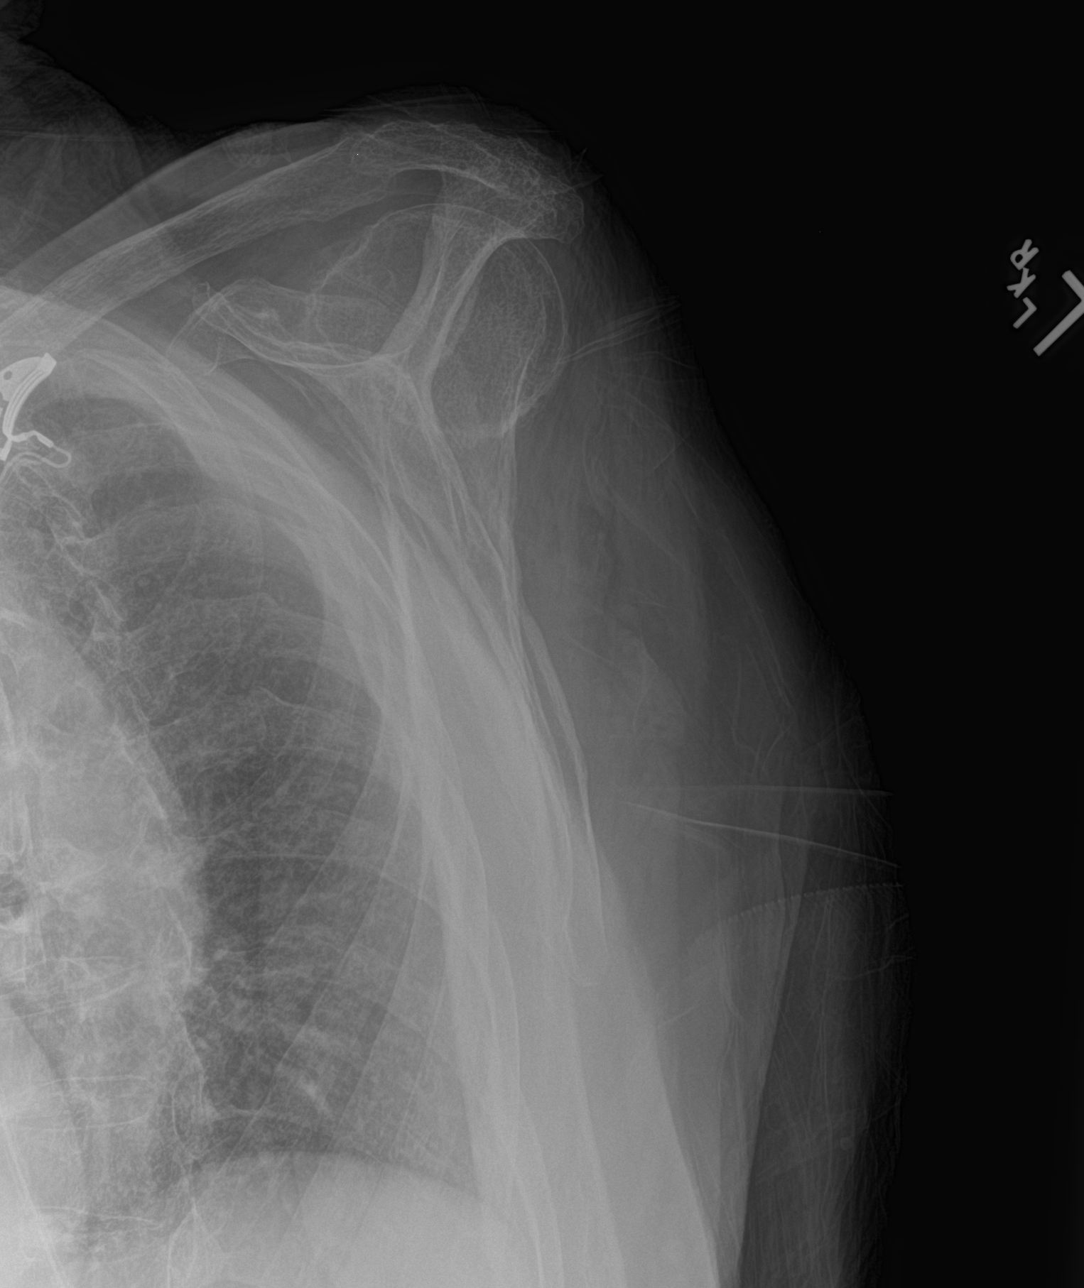

[shoulder grashey (2 of 2)]
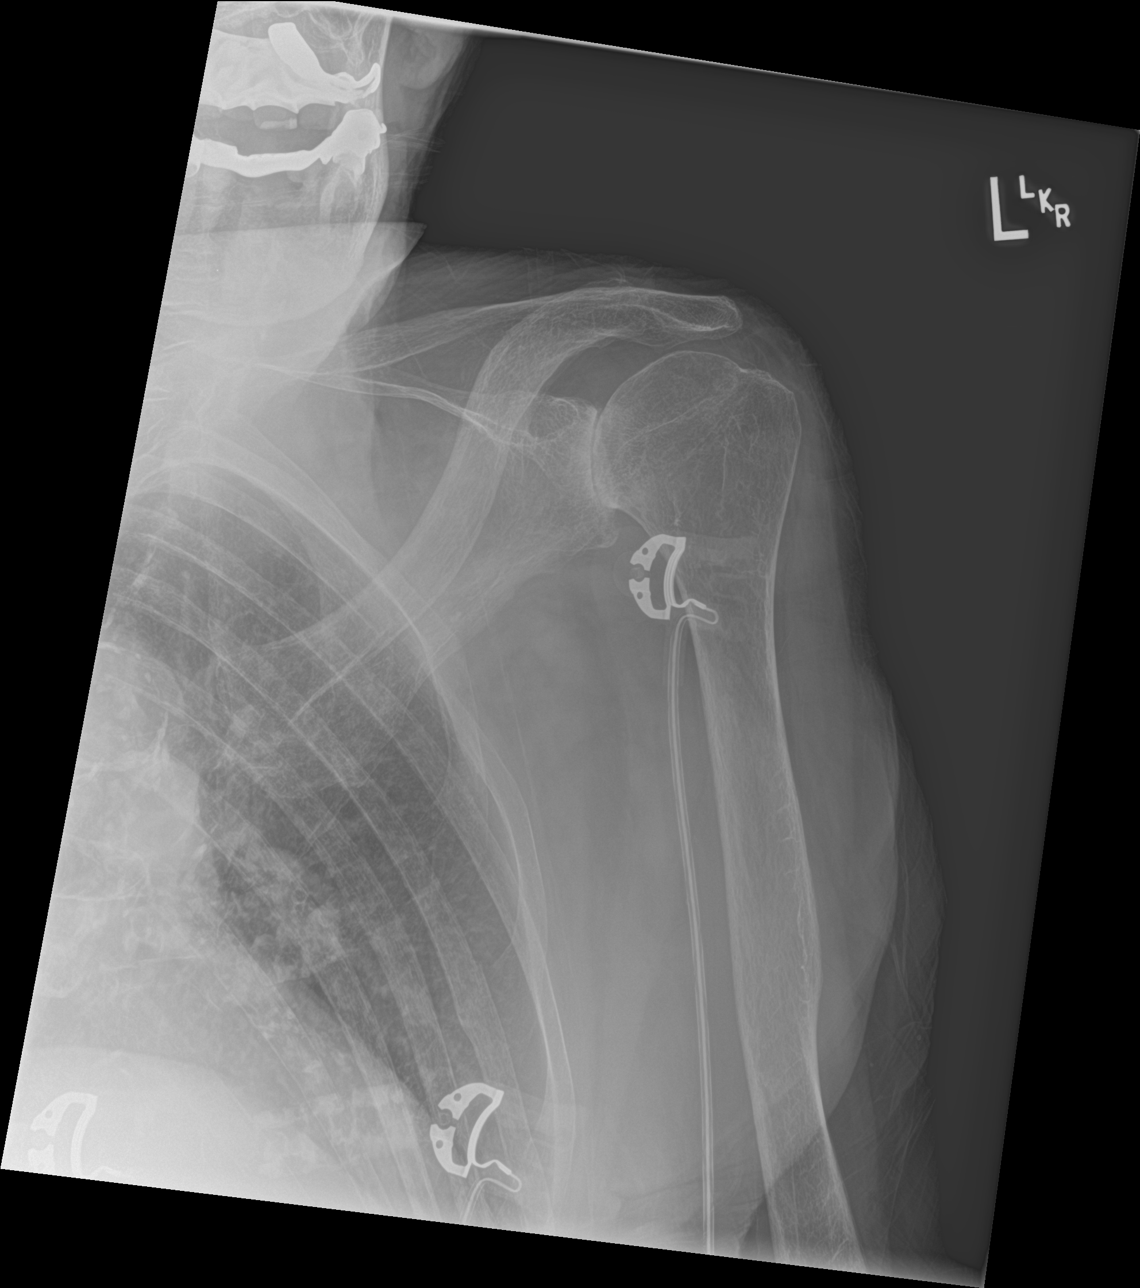

[3 of 3 positions shown; findings below may reference images not displayed]

FINDINGS: The bones are demineralized. There is no evidence of acute fracture
or dislocation. There are glenohumeral degenerative changes. The
subacromial space appears adequately maintained. Probable old
left-sided rib fractures are noted.
IMPRESSION: No acute left shoulder findings. Osteopenia and glenohumeral
degenerative changes noted.

## 2017-04-25 IMAGING — CR DG CHEST 1V PORT
1 series · 1 of 1 positions shown · non-contrast
Comparison: Chest x-ray 07/19/2009.

CLINICAL DATA: [AGE] female with left-sided weakness since
this morning. Unable to walk.

EXAM:
PORTABLE CHEST 1 VIEW

[ap]
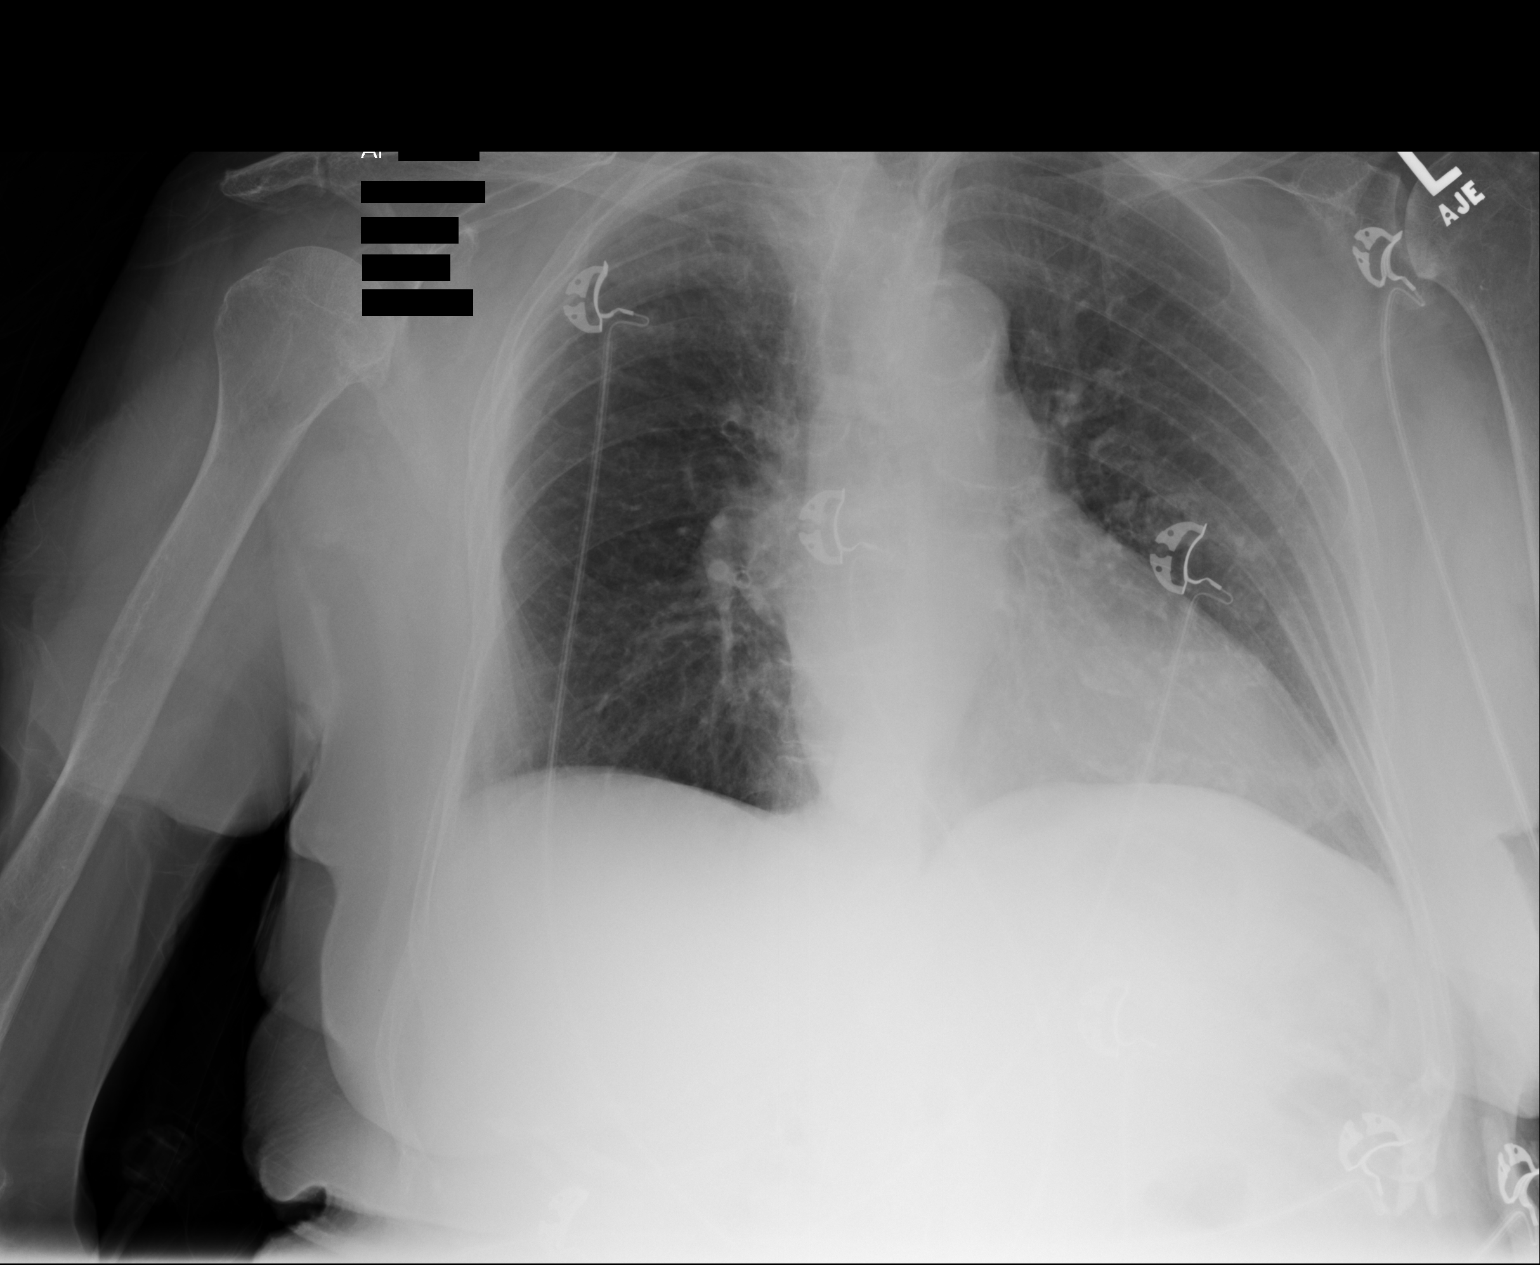

[1 of 1 positions shown; findings below may reference images not displayed]

FINDINGS: Lung volumes are normal. No consolidative airspace disease. No
pleural effusions. No pneumothorax. No pulmonary nodule or mass
noted. Pulmonary vasculature and the cardiomediastinal silhouette
are within normal limits. Atherosclerosis in the thoracic aorta.
IMPRESSION: 1.  No radiographic evidence of acute cardiopulmonary disease.
2. Atherosclerosis.
